# Patient Record
Sex: Male | Born: 1985 | Race: White | Hispanic: No | Marital: Single | Smoking: Light tobacco smoker
Health system: Southern US, Community
[De-identification: ages and names within clinical notes are randomized; demographics above are authoritative.]

## PROBLEM LIST (undated history)

## (undated) DIAGNOSIS — I10 Essential (primary) hypertension: Secondary | ICD-10-CM

---

## 2013-04-10 ENCOUNTER — Emergency Department (HOSPITAL_COMMUNITY)
Admission: EM | Admit: 2013-04-10 | Discharge: 2013-04-10 | Disposition: A | Discharge status: Home / Self Care | Payer: BC Managed Care – PPO | Source: Home / Self Care | Attending: Family Medicine | Admitting: Family Medicine

## 2013-04-10 ENCOUNTER — Encounter (HOSPITAL_COMMUNITY): Payer: Self-pay | Admitting: Emergency Medicine

## 2013-04-10 DIAGNOSIS — K589 Irritable bowel syndrome without diarrhea: Secondary | ICD-10-CM

## 2013-04-10 HISTORY — DX: Essential (primary) hypertension: I10

## 2013-04-10 MED ORDER — LACTINEX PO CHEW: 1.0000 | CHEWABLE_TABLET | Freq: Three times a day (TID) | ORAL | Status: DC

## 2013-04-10 MED ORDER — HYOSCYAMINE SULFATE 0.125 MG PO TABS: 0.1250 mg | ORAL_TABLET | ORAL | Status: DC | PRN

## 2013-04-10 MED ORDER — LIDOCAINE HCL (PF) 1 % IJ SOLN
INTRAMUSCULAR | Status: AC
  Filled 2013-04-10: qty 5

## 2013-04-10 NOTE — ED Notes (Signed)
Pt  Reports   abd  Pain   Described  A s  A  Fullness       With  The  Pain more  To the  r  Side      Symptoms  X  2  Weeks    -  Pt  Reports  As   Well  Symptoms  Of  intermittant  Diarrhea /     Constipation            X  6  Months            Pt  denys  Any  Nausea  /  Vomiting         He  Is  Sitting  Upright on  nexam table  Speaking in  Complete  sentances  In no  Severe  Distress

## 2013-04-10 NOTE — ED Provider Notes (Signed)
Isaiah Turner is a 27 y.o. male who presents to Urgent Care today for abdominal pain. He reports right upper abdmen pinching pain 4/10, making it uncomfortable to sit or lay, for the past 3 days making it hard to sleep. He states pain is constant. He reports 2 weeks of fecal urgency without actually stooling. He also reports pencil-thin stools with lots of mucus and varying constipation or diarrhea. For past 6 months, he has had constipation and diarrhea with lots of mucus in stool. He was on remeron and wellbutrin but stopped these 3 weeks ago but with no subsequent improvement. He does not report nausea or vomiting. He reports mild back pain and a few drops of blood two times in the past 2 weeks with straining. He denies fevers/chills, skin changes, rectal pain or anorectal skin changes, change in appetite, or increased gas. He reports an overall 15 lb weight gain over the past "few months" after starting remeron. He has tried increasing water intake, cutting out alcohol, and cutting back on caffeine to 2 energy drinks per day (200mg  caffeine) compared to previous 3 energy drinks daily. 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   History reviewed. No pertinent past medical history. History  Substance Use Topics  . Smoking status: Never Smoker   . Smokeless tobacco: Not on file  . Alcohol Use: Yes  2 w ago and prior, 6 pack etoh / week; stopped. No tobacco Marijuana use 1 mo ago x 1.  ROS as above Medications reviewed. No current facility-administered medications for this encounter.   Current Outpatient Prescriptions  Medication Sig Dispense Refill  . MINOCYCLINE HCL PO Take by mouth.      . Omega-3 Fatty Acids (FISH OIL PO) Take by mouth.      Marland Kitchen PROPRANOLOL HCL PO Take by mouth.      Melatonin  NKDA  Exam:  BP 153/83  Pulse 94  Temp(Src) 97.6 F (36.4 C) (Oral)  Resp 16  SpO2  100% Gen: Well NAD HEENT: EOMI,  MMM, o/p clear with no ulcers Lungs: Normal work of breathing. CTABL Heart: RRR no MRG Abd: NABS, Soft. Tender right upper quadrant moderate, ND, nonacute Exts: Non edematous BL  LE, warm and well perfused.   No results found for this or any previous visit (from the past 24 hour(s)). No results found.  Assessment and Plan: 27 y.o. male with right upper quadrant abdominal pain with no fevers or chills, not related to food, for last 2 weeks. Unlikely cholecystitis given no fever and not characteristic with no nausea/vomiting and no relationship to food. 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 However, would like pt to be evaluated for IBD given FH and mucus with intermittent drops of blood in stool. - Information for gastroenterologist provided and recommended pt be seen in 1-2 weeks. - Rx'ed hyoscyamine x 1-2 weeks - Recommended restarting antidepressants as anxiety/depression control can help IBS control. - Rx'ed probiotic and recommended continuing yogurt and increasing fiber. - Cut back on caffeine, continue hydration. - Return precautions reviewed. - Elevated BP today likely related to anxiety. F/u with PCP.  Leona Singleton, MD 04/10/13 [phone]

## 2013-04-10 NOTE — ED Provider Notes (Signed)
Medical screening examination/treatment/procedure(s) were performed by resident physician or non-physician practitioner and as supervising physician I was immediately available for consultation/collaboration.   Ailsa Mireles DOUGLAS MD.   Jorie Zee D Merleen Picazo, MD 04/10/13 1659 

## 2013-04-21 ENCOUNTER — Ambulatory Visit
Admission: RE | Admit: 2013-04-21 | Discharge: 2013-04-21 | Disposition: A | Discharge status: Home / Self Care | Payer: BC Managed Care – PPO | Source: Ambulatory Visit | Attending: Gastroenterology | Admitting: Gastroenterology

## 2013-04-21 ENCOUNTER — Other Ambulatory Visit: Payer: Self-pay | Admitting: Gastroenterology

## 2013-04-21 DIAGNOSIS — R109 Unspecified abdominal pain: Secondary | ICD-10-CM

## 2013-04-21 MED ORDER — IOHEXOL 300 MG/ML  SOLN
125.0000 mL | Freq: Once | INTRAMUSCULAR | Status: AC | PRN
  Administered 2013-04-21: 125 mL via INTRAVENOUS

## 2013-04-21 MED ORDER — IOHEXOL 300 MG/ML  SOLN
30.0000 mL | Freq: Once | INTRAMUSCULAR | Status: AC | PRN
  Administered 2013-04-21: 30 mL via ORAL

## 2013-10-09 ENCOUNTER — Emergency Department (HOSPITAL_COMMUNITY)
Admission: EM | Admit: 2013-10-09 | Discharge: 2013-10-09 | Disposition: A | Discharge status: Home / Self Care | Payer: BC Managed Care – PPO | Source: Home / Self Care

## 2013-10-09 ENCOUNTER — Encounter (HOSPITAL_COMMUNITY): Payer: Self-pay | Admitting: Emergency Medicine

## 2013-10-09 DIAGNOSIS — N5089 Other specified disorders of the male genital organs: Secondary | ICD-10-CM

## 2013-10-09 DIAGNOSIS — N509 Disorder of male genital organs, unspecified: Secondary | ICD-10-CM

## 2013-10-09 LAB — POCT URINALYSIS DIP (DEVICE)
Bilirubin Urine: NEGATIVE
GLUCOSE, UA: NEGATIVE mg/dL
Hgb urine dipstick: NEGATIVE
KETONES UR: NEGATIVE mg/dL
Leukocytes, UA: NEGATIVE
Nitrite: NEGATIVE
PROTEIN: NEGATIVE mg/dL
SPECIFIC GRAVITY, URINE: 1.02 (ref 1.005–1.030)
UROBILINOGEN UA: 0.2 mg/dL (ref 0.0–1.0)
pH: 5.5 (ref 5.0–8.0)

## 2013-10-09 NOTE — Discharge Instructions (Signed)
For worsening as listed above go to the Emergency department. If not improving in 24 hours recheck. Recommended go to the ED today. You declined and advised of possible worsening involving injury to reproductive and urinary organs. Ibuprofen for pain.

## 2013-10-09 NOTE — ED Provider Notes (Signed)
CSN: [id number]     Arrival date & time 10/09/13  1523 History   First MD Initiated Contact with Patient 10/09/13 1639     Chief Complaint  Patient presents with  . Inguinal Hernia   (Consider location/radiation/quality/duration/timing/severity/associated sxs/prior Treatment) HPI Comments: Gradual onset of pain in the right pelvic inlet halfway between the medial thigh and scrotum. No trauma. Worse with sitting. Ob includes lifting, pulling. Denies urinary sx's.   Past Medical History  Diagnosis Date  . Hypertension    History reviewed. No pertinent past surgical history. History reviewed. No pertinent family history. History  Substance Use Topics  . Smoking status: Never Smoker   . Smokeless tobacco: Not on file  . Alcohol Use: Yes    Review of Systems  Constitutional: Negative.   Gastrointestinal: Negative for nausea, vomiting and abdominal pain.  Genitourinary: Negative for dysuria, urgency, frequency, flank pain, discharge, penile swelling, scrotal swelling, difficulty urinating, genital sores, penile pain and testicular pain.  Musculoskeletal: Negative.   Skin: Negative.     Allergies  Review of patient's allergies indicates no known allergies.  Home Medications   Prior to Admission medications   Medication Sig Start Date End Date Taking? Authorizing Provider  hyoscyamine (LEVSIN, ANASPAZ) 0.125 MG tablet Take 1 tablet (0.125 mg total) by mouth every 4 (four) hours as needed. 04/10/13   Leona SingletonMaria T Thekkekandam, MD  lactobacillus acidophilus & bulgar (LACTINEX) chewable tablet Chew 1 tablet by mouth 3 (three) times daily with meals. 04/10/13   Leona SingletonMaria T Thekkekandam, MD  MINOCYCLINE HCL PO Take by mouth.    Historical Provider, MD  Omega-3 Fatty Acids (FISH OIL PO) Take by mouth.    Historical Provider, MD  PROPRANOLOL HCL PO Take by mouth.    Historical Provider, MD   BP 134/85  Pulse 106  Temp(Src) 98.4 F (36.9 C) (Oral)  Resp 16  SpO2 100% Physical Exam   Nursing note and vitals reviewed. Constitutional: He is oriented to person, place, and time. He appears well-developed and well-nourished. No distress.  Pulmonary/Chest: Effort normal. No respiratory distress.  Abdominal: Soft. He exhibits no distension. There is no tenderness.  Genitourinary: Penis normal. No penile tenderness.  NEMG. No observed swelling, discoloration, lesions or other abnormalities. No discharge. Bilat testicles descended, no enlargement or tenderness. Normal orientation. No scrotal swelling or lesions. No fluid.No epididymal swelling or tenderness. Nl cremasteric reflexes.  Attempting R cough tap reflex for hernia testing elicits much pain with deep intrusion but no palpable masses. Similar maneuver to the area between the scrotum and proximal/medial thigh produces same pain with deep intrusion, no masses.  Neurological: He is alert and oriented to person, place, and time.  Skin: Skin is warm and dry.  Psychiatric: He has a normal mood and affect.    ED Course  Procedures (including critical care time) Labs Review Labs Reviewed  POCT URINALYSIS DIP (DEVICE)    Imaging Review No results found.   MDM   1. Pain of male genitalia     Discussed findings with Dr. Lorenz CoasterKeller who also examined pt. No definitive dx on exam. No evidence of torsion or diminished circulatory flow.  Pt advised to go the the Ed for additional eval to include imaging. Pt reluctantly declines and wants to wait tonight. Will go for red flag problems discussed and written in instructions.   Hayden Rasmussenavid Mabe, NP 10/09/13 1725

## 2013-10-09 NOTE — ED Notes (Signed)
Concern for poss inguinal hernia. C/o pain right groin area x 2 hours. C/o hurts to sit or walk

## 2013-10-10 ENCOUNTER — Emergency Department (HOSPITAL_COMMUNITY)
Admission: EM | Admit: 2013-10-10 | Discharge: 2013-10-10 | Disposition: A | Discharge status: Home / Self Care | Payer: BC Managed Care – PPO | Attending: Emergency Medicine | Admitting: Emergency Medicine

## 2013-10-10 ENCOUNTER — Emergency Department (HOSPITAL_COMMUNITY): Payer: BC Managed Care – PPO

## 2013-10-10 ENCOUNTER — Encounter (HOSPITAL_COMMUNITY): Payer: Self-pay | Admitting: Emergency Medicine

## 2013-10-10 DIAGNOSIS — R109 Unspecified abdominal pain: Secondary | ICD-10-CM | POA: Insufficient documentation

## 2013-10-10 DIAGNOSIS — R197 Diarrhea, unspecified: Secondary | ICD-10-CM | POA: Insufficient documentation

## 2013-10-10 DIAGNOSIS — I10 Essential (primary) hypertension: Secondary | ICD-10-CM | POA: Insufficient documentation

## 2013-10-10 DIAGNOSIS — R634 Abnormal weight loss: Secondary | ICD-10-CM | POA: Insufficient documentation

## 2013-10-10 DIAGNOSIS — Z792 Long term (current) use of antibiotics: Secondary | ICD-10-CM | POA: Insufficient documentation

## 2013-10-10 DIAGNOSIS — N508 Other specified disorders of male genital organs: Secondary | ICD-10-CM | POA: Insufficient documentation

## 2013-10-10 DIAGNOSIS — R1031 Right lower quadrant pain: Secondary | ICD-10-CM

## 2013-10-10 DIAGNOSIS — R110 Nausea: Secondary | ICD-10-CM | POA: Insufficient documentation

## 2013-10-10 LAB — CBC WITH DIFFERENTIAL/PLATELET
BASOS ABS: 0 10*3/uL (ref 0.0–0.1)
BASOS PCT: 0 % (ref 0–1)
EOS ABS: 0 10*3/uL (ref 0.0–0.7)
EOS PCT: 0 % (ref 0–5)
HCT: 40.4 % (ref 39.0–52.0)
Hemoglobin: 14.1 g/dL (ref 13.0–17.0)
Lymphocytes Relative: 29 % (ref 12–46)
Lymphs Abs: 2.6 10*3/uL (ref 0.7–4.0)
MCH: 31.3 pg (ref 26.0–34.0)
MCHC: 34.9 g/dL (ref 30.0–36.0)
MCV: 89.8 fL (ref 78.0–100.0)
Monocytes Absolute: 0.8 10*3/uL (ref 0.1–1.0)
Monocytes Relative: 9 % (ref 3–12)
NEUTROS PCT: 62 % (ref 43–77)
Neutro Abs: 5.7 10*3/uL (ref 1.7–7.7)
PLATELETS: 308 10*3/uL (ref 150–400)
RBC: 4.5 MIL/uL (ref 4.22–5.81)
RDW: 12.7 % (ref 11.5–15.5)
WBC: 9.2 10*3/uL (ref 4.0–10.5)

## 2013-10-10 LAB — COMPREHENSIVE METABOLIC PANEL
ALT: 42 U/L (ref 0–53)
AST: 28 U/L (ref 0–37)
Albumin: 4.8 g/dL (ref 3.5–5.2)
Alkaline Phosphatase: 104 U/L (ref 39–117)
BUN: 14 mg/dL (ref 6–23)
CALCIUM: 9.8 mg/dL (ref 8.4–10.5)
CO2: 27 mEq/L (ref 19–32)
Chloride: 102 mEq/L (ref 96–112)
Creatinine, Ser: 1.02 mg/dL (ref 0.50–1.35)
GFR calc non Af Amer: >90 mL/min (ref >90)
GLUCOSE: 90 mg/dL (ref 70–99)
POTASSIUM: 4.1 meq/L (ref 3.7–5.3)
Sodium: 142 mEq/L (ref 137–147)
TOTAL PROTEIN: 8 g/dL (ref 6.0–8.3)
Total Bilirubin: 0.7 mg/dL (ref 0.3–1.2)

## 2013-10-10 LAB — URINALYSIS, ROUTINE W REFLEX MICROSCOPIC
Bilirubin Urine: NEGATIVE
GLUCOSE, UA: NEGATIVE mg/dL
Hgb urine dipstick: NEGATIVE
KETONES UR: 15 mg/dL — AB
Leukocytes, UA: NEGATIVE
NITRITE: NEGATIVE
PROTEIN: NEGATIVE mg/dL
Specific Gravity, Urine: 1.025 (ref 1.005–1.030)
Urobilinogen, UA: 0.2 mg/dL (ref 0.0–1.0)
pH: 5.5 (ref 5.0–8.0)

## 2013-10-10 MED ORDER — OXYCODONE-ACETAMINOPHEN 5-325 MG PO TABS: 1.0000 | ORAL_TABLET | ORAL | Status: AC | PRN

## 2013-10-10 MED ORDER — OXYCODONE-ACETAMINOPHEN 5-325 MG PO TABS
1.0000 | ORAL_TABLET | Freq: Once | ORAL | Status: AC
  Administered 2013-10-10: 1 via ORAL
  Filled 2013-10-10: qty 1

## 2013-10-10 NOTE — Discharge Instructions (Signed)
Take percocet as needed for severe pain - Please be careful with this medication.  It can cause drowsiness.  Use caution while driving, operating machinery, drinking alcohol, or any other activities that may impair your physical or mental abilities.   Use a scrotal pillow or other means of comfort at home  Return to the emergency department if you develop any changing/worsening condition, difficulty with urination, fever, repeated vomiting, abdominal pain, or any other concerns (please read additional information regarding your condition below)    Inguinal Strain Your exam shows you have an inguinal strain. This is also known as a pulled groin. This injury is usually due to a pull or partial tear to a muscle or tendon in the groin area. Most groin pulls take several weeks to heal completely. There may be pain with lifting your leg or walking during much of your recovery. Treatment for groin strains includes:  Rest and avoid lifting or performing activities that increase your pain.  Apply ice packs for 20-30 minutes every few hours to reduce pain and swelling over the next 2-3 days.  Medicine to reduce pain and inflammation is often prescribed. HOME CARE INSTRUCTIONS  While most strains in the groin area will heal with rest, you should also watch for any signs of a more serious condition.  SEEK IMMEDIATE MEDICAL CARE IF:   You notice unusual swelling or bulging in the groin.  You have pain or swelling in the testicle.  Blood in your urine.  Marked increased pain.  Weakness or numbness of your leg or abdominal pain. MAKE SURE YOU:   Understand these instructions.  Will watch your condition.  Will get help right away if you are not doing well or get worse. Document Released: 05/17/2004 Document Revised: 07/02/2011 Document Reviewed: 08/14/2007 [facility]Exit[facility] Patient Information 2015 [place], [place]. This information is not intended to replace advice given to you by your health care  provider. Make sure you discuss any questions you have with your health care provider.  Groin Strain A groin strain (also called a groin pull) is an injury to the muscles or tendon on the upper inner part of the thigh. These muscles are called the adductor muscles or groin muscles. They are responsible for moving the leg across the body. A muscle strain occurs when a muscle is overstretched and some muscle fibers are torn. A groin strain can range from mild to severe depending on how many muscle fibers are affected and whether the muscle fibers are partially or completely torn.  Groin strains usually occur during exercise or participation in sports. The injury often happens when a sudden, violent force is placed on a muscle, stretching the muscle too far. A strain is more likely to occur when your muscles are not warmed up or if you are not properly conditioned. Depending on the severity of the groin strain, recovery time may vary from a few weeks to several weeks. Severe injuries often require 4-6 weeks for recovery. In these cases, complete healing can take 4-5 months.  CAUSES   Stretching the groin muscles too far or too suddenly, often during side-to-side motion with an abrupt change in direction.  Putting repeated stress on the groin muscles over a long period of time.  Performing vigorous activity without properly stretching the groin muscles beforehand. SYMPTOMS   Pain and tenderness in the groin area. This begins as sharp pain and persists as a dull ache.  Popping or snapping feeling when the injury occurs (for severe strains).  Swelling or bruising.  Muscle spasms.  Weakness in the leg.  Stiffness in the groin area with decreased ability to move the affected muscles. DIAGNOSIS  Your caregiver will perform a physical exam to diagnose a groin strain. You will be asked about your symptoms and how the injury occurred. X-rays are sometimes needed to rule out a broken bone or cartilage  problems. Your caregiver may order a CT scan or MRI if a complete muscle tear is suspected. TREATMENT  A groin strain will often heal on its own. Your caregiver may prescribe medicines to help manage pain and swelling (anti-inflammatory medicine). You may be told to use crutches for the first few days to minimize your pain. HOME CARE INSTRUCTIONS   Rest. Do not use the strained muscle if it causes pain.  Put ice on the injured area.  Put ice in a plastic bag.  Place a towel between your skin and the bag.  Leave the ice on for 15-20 minutes, every 2-3 hours. Do this for the first 2 days after the injury.  Only take over-the-counter or prescription medicines as directed by your caregiver.  Wrap the injured area with an elastic bandage as directed by your caregiver.  Keep the injured leg raised (elevated).  Walk, stretch, and perform range-of-motion exercises to improve blood flow to the injured area. Only perform these activities if you can do so without any pain. To prevent muscle strains:  Warm up before exercise.  Develop proper conditioning and strength in the groin muscles. SEEK IMMEDIATE MEDICAL CARE IF:   You have increased pain or swelling in the affected area.   Your symptoms are not improving or are getting worse. MAKE SURE YOU:   Understand these instructions.  Will watch your condition.  Will get help right away if you are not doing well or get worse. Document Released: 12/06/2003 Document Revised: 03/26/2012 Document Reviewed: 12/12/2011 [facility]Exit[facility] Patient Information 2015 [place], [place]. This information is not intended to replace advice given to you by your health care provider. Make sure you discuss any questions you have with your health care provider.  Testicular Self-Exam A self-examination of your testicles involves looking at and feeling your testicles for abnormal lumps or swelling. Several things can cause swelling, lumps, or pain in your testicles.  Some of these causes are:  Injuries.  Inflammation.  Infection.  Accumulation of fluids around your testicle (hydrocele).  Twisted testicles (testicular torsion).  Testicular cancer. Self-examination of the testicles and groin areas may be advised if you are at risk for testicular cancer. Risks for testicular cancer include:  An undescended testicle (cryptorchidism).  A history of previous testicular cancer.  A family history of testicular cancer. The testicles are easiest to examine after warm baths or showers and are more difficult to examine when you are cold. This is because the muscles attached to the testicles retract and pull them up higher or into the abdomen. Follow these steps while you are standing:  Hold your penis away from your body.  Roll one testicle between your thumb and forefinger, feeling the entire testicle.  Roll the other testicle between your thumb and forefinger, feeling the entire testicle. Feel for lumps, swelling, or discomfort. A normal testicle is egg shaped and feels firm. It is smooth and not tender. The spermatic cord can be felt as a firm spaghetti-like cord at the back of your testicle. It is also important to examine the crease between the front of your leg and your abdomen. Feel  for any bumps that are tender. These could be enlarged lymph nodes.  Document Released: 07/16/2000 Document Revised: 12/10/2012 Document Reviewed: 09/29/2012 [facility] Patient Information 2015 [place], [place]. This information is not intended to replace advice given to you by your health care provider. Make sure you discuss any questions you have with your health care provider.

## 2013-10-10 NOTE — ED Notes (Signed)
Patient declined wheelchair. RN escorted to exit 

## 2013-10-10 NOTE — ED Provider Notes (Signed)
CSN: [id number]     Arrival date & time 10/10/13  1626 History   First MD Initiated Contact with Patient 10/10/13 1629     Chief Complaint  Patient presents with  . Groin Pain   HPI  Jamse Arnndrew T Gorley is a 28 y.o. male with a PMH of HTN who presents to the ED for evaluation of groin pain. History was provided by the patient. Patient states that he has had gradually worsening right groin pain since yesterday at 2:00 pm. His constant pain is located in his right groin and testicle. He denies any injuries or trauma. He describes an aching discomfort which is worse with sitting and movement. 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 He denies any previous pain in the past. He states he took Ibuprofen with no relief. He denies any dysuria, penile discharge, scrotal edema/erytheam, genital sores, penile pain, or abdominal pain. He has had nausea with no emesis. Also loose stools. No hematochezia. No also reports a 20 pound weight loss over the past month, which was unintentional. No fevers, night sweats, cough, headache, or other concerns. Admits to being sexually active with new recent sexual partners. [facility] and was advised to come to the ED however he went home and comes to the ED today for continued pain and concerns.     Past Medical History  Diagnosis Date  . Hypertension    No past surgical history on file. No family history on file. History  Substance Use Topics  . Smoking status: Never Smoker   . Smokeless tobacco: Not on file  . Alcohol Use: Yes    Review of Systems  Constitutional: Positive for unexpected weight change. Negative for fever, chills, activity change, appetite change and fatigue.  Respiratory: Negative for cough and shortness of breath.   Cardiovascular: Negative for chest pain and leg swelling.  Gastrointestinal: Positive for nausea and diarrhea. Negative for  vomiting, abdominal pain, constipation and blood in stool.  Genitourinary: Positive for testicular pain. Negative for dysuria, urgency, frequency, hematuria, flank pain, decreased urine volume, discharge, penile swelling, scrotal swelling, difficulty urinating, genital sores and penile pain.  Musculoskeletal: Negative for back pain and myalgias.  Neurological: Negative for dizziness, weakness, light-headedness and headaches.    Allergies  Review of patient's allergies indicates no known allergies.  Home Medications   Prior to Admission medications   Medication Sig Start Date End Date Taking? Authorizing Provider  hyoscyamine (LEVSIN, ANASPAZ) 0.125 MG tablet Take 1 tablet (0.125 mg total) by mouth every 4 (four) hours as needed. 04/10/13   Leona SingletonMaria T Thekkekandam, MD  lactobacillus acidophilus & bulgar (LACTINEX) chewable tablet Chew 1 tablet by mouth 3 (three) times daily with meals. 04/10/13   Leona SingletonMaria T Thekkekandam, MD  MINOCYCLINE HCL PO Take by mouth.    Historical Provider, MD  Omega-3 Fatty Acids (FISH OIL PO) Take by mouth.    Historical Provider, MD  PROPRANOLOL HCL PO Take by mouth.    Historical Provider, MD   BP 150/102  Pulse 105  Temp(Src) 98.2 F (36.8 C) (Oral)  Resp 18  SpO2 99%  Filed Vitals:   10/10/13 1848 10/10/13 1900 10/10/13 2002 10/10/13 2004  BP: 134/77 139/79 165/103 143/82  Pulse: 92 87 101   Temp: 98.7 F (37.1 C)  97.9 F (36.6 C)   TempSrc: Oral  Oral   Resp: 18  18  SpO2: 93% 94% 100%     Physical Exam  Nursing note and vitals reviewed. Constitutional: He is oriented to person, place, and time. He appears well-developed and well-nourished. No distress.  HENT:  Head: Normocephalic and atraumatic.  Right Ear: External ear normal.  Left Ear: External ear normal.  Mouth/Throat: Oropharynx is clear and moist.  Eyes: Conjunctivae are normal. Left eye exhibits no discharge.  Neck: Normal range of motion. Neck supple.  Cardiovascular: Normal rate,  regular rhythm and normal heart sounds.  Exam reveals no gallop and no friction rub.   No murmur heard. Pulmonary/Chest: Effort normal and breath sounds normal. No respiratory distress. He has no wheezes. He has no rales. He exhibits no tenderness.  Abdominal: Soft. He exhibits no distension and no mass. There is no tenderness. There is no rebound and no guarding.  Genitourinary:  Tenderness to palpation to the right testicle. Negative Phren's sign. No scrotal edema or erythema. No left testicle tenderness. Patient has pain with palpation in his inguinal canal on the right with no inguinal hernia. No inguinal canal tenderness or hernia on the left. No inguinal LAD bilaterally. No groin masses or edema bilaterally.   Musculoskeletal: He exhibits no edema and no tenderness.  No CVA, lumbar, or flank tenderness bilaterally.   Neurological: He is alert and oriented to person, place, and time.  Skin: Skin is warm and dry. He is not diaphoretic.    ED Course  Procedures (including critical care time) Labs Review Labs Reviewed - No data to display  Imaging Review Ct Abdomen Pelvis Wo Contrast  10/10/2013   CLINICAL DATA:  28 year old male with abdominal and pelvic pain and nausea.  EXAM: CT ABDOMEN AND PELVIS WITHOUT CONTRAST  TECHNIQUE: Multidetector CT imaging of the abdomen and pelvis was performed following the standard protocol without IV contrast.  COMPARISON:  The lung bases are clear.  The liver, gallbladder, spleen, pancreas, adrenal glands and kidneys are unremarkable.  There is no evidence of hydronephrosis or urinary calculi.  Please note that parenchymal abnormalities may be missed without intravenous contrast.  There is no evidence of free fluid, enlarged lymph nodes, biliary dilation or abdominal aortic aneurysm.  The bowel and bladder are unremarkable. There is no evidence of bowel obstruction, pneumoperitoneum or focal collection.  No acute or suspicious bony abnormalities are present.   FINDINGS: Unremarkable exam.  No evidence of acute or significant abnormality.   Electronically Signed   By: Laveda AbbeJeff  Hu M.D.   On: 10/10/2013 19:43   [place] Scrotum  10/10/2013   CLINICAL DATA:  28 year old male with right testicular pain.  EXAM: SCROTAL ULTRASOUND  DOPPLER ULTRASOUND OF THE TESTICLES  TECHNIQUE: Complete ultrasound examination of the testicles, epididymis, and other scrotal structures was performed. Color and spectral Doppler ultrasound were also utilized to evaluate blood flow to the testicles.  COMPARISON:  None.  FINDINGS: Right testicle  Measurements: 3.7 x 1.9 x 3 cm. No mass or microlithiasis visualized.  Left testicle  Measurements: 4.5 x 2.3 x 4.1 cm. No mass or microlithiasis visualized.  Right epididymis:  Normal in size and appearance.  Left epididymis:  A 3 mm epididymal cyst is present.  Hydrocele:  Small bilateral hydroceles are present.  Varicocele:  None visualized.  Pulsed Doppler interrogation of both testes demonstrates low resistance arterial and venous waveforms bilaterally.  IMPRESSION: Normal testicles.  No evidence of testicular torsion or mass.  Small bilateral hydroceles without other significant abnormality.   Electronically Signed   By: Trey PaulaJeff  Hu M.D.   On: 10/10/2013 17:34   [place] Art/ven Flow Abd Pelv Doppler  10/10/2013   CLINICAL DATA:  28 year old male with right testicular pain.  EXAM: SCROTAL ULTRASOUND  DOPPLER ULTRASOUND OF THE TESTICLES  TECHNIQUE: Complete ultrasound examination of the testicles, epididymis, and other scrotal structures was performed. Color and spectral Doppler ultrasound were also utilized to evaluate blood flow to the testicles.  COMPARISON:  None.  FINDINGS: Right testicle  Measurements: 3.7 x 1.9 x 3 cm. No mass or microlithiasis visualized.  Left testicle  Measurements: 4.5 x 2.3 x 4.1 cm. No mass or microlithiasis visualized.  Right epididymis:  Normal in size and appearance.  Left epididymis:  A 3 mm epididymal cyst is present.  Hydrocele:   Small bilateral hydroceles are present.  Varicocele:  None visualized.  Pulsed Doppler interrogation of both testes demonstrates low resistance arterial and venous waveforms bilaterally.  IMPRESSION: Normal testicles.  No evidence of testicular torsion or mass.  Small bilateral hydroceles without other significant abnormality.   Electronically Signed   By: Laveda Abbe M.D.   On: 10/10/2013 17:34     EKG Interpretation None      Results for orders placed during the hospital encounter of 10/10/13  URINALYSIS, ROUTINE W REFLEX MICROSCOPIC      Result Value Ref Range   Color, Urine YELLOW  YELLOW   APPearance CLEAR  CLEAR   Specific Gravity, Urine 1.025  1.005 - 1.030   pH 5.5  5.0 - 8.0   Glucose, UA NEGATIVE  NEGATIVE mg/dL   Hgb urine dipstick NEGATIVE  NEGATIVE   Bilirubin Urine NEGATIVE  NEGATIVE   Ketones, ur 15 (*) NEGATIVE mg/dL   Protein, ur NEGATIVE  NEGATIVE mg/dL   Urobilinogen, UA 0.2  0.0 - 1.0 mg/dL   Nitrite NEGATIVE  NEGATIVE   Leukocytes, UA NEGATIVE  NEGATIVE  CBC WITH DIFFERENTIAL      Result Value Ref Range   WBC 9.2  4.0 - 10.5 K/uL   RBC 4.50  4.22 - 5.81 MIL/uL   Hemoglobin 14.1  13.0 - 17.0 g/dL   HCT 16.1  09.6 - 04.5 %   MCV 89.8  78.0 - 100.0 fL   MCH 31.3  26.0 - 34.0 pg   MCHC 34.9  30.0 - 36.0 g/dL   RDW 40.9  81.1 - 91.4 %   Platelets 308  150 - 400 K/uL   Neutrophils Relative % 62  43 - 77 %   Neutro Abs 5.7  1.7 - 7.7 K/uL   Lymphocytes Relative 29  12 - 46 %   Lymphs Abs 2.6  0.7 - 4.0 K/uL   Monocytes Relative 9  3 - 12 %   Monocytes Absolute 0.8  0.1 - 1.0 K/uL   Eosinophils Relative 0  0 - 5 %   Eosinophils Absolute 0.0  0.0 - 0.7 K/uL   Basophils Relative 0  0 - 1 %   Basophils Absolute 0.0  0.0 - 0.1 K/uL  COMPREHENSIVE METABOLIC PANEL      Result Value Ref Range   Sodium 142  137 - 147 mEq/L   Potassium 4.1  3.7 - 5.3 mEq/L   Chloride 102  96 - 112 mEq/L   CO2 27  19 - 32 mEq/L   Glucose, Bld 90  70 - 99 mg/dL   BUN 14  6 - 23 mg/dL    Creatinine, Ser 7.82  0.50 - 1.35 mg/dL   Calcium 9.8  8.4 -  10.5 mg/dL   Total Protein 8.0  6.0 - 8.3 g/dL   Albumin 4.8  3.5 - 5.2 g/dL   AST 28  0 - 37 U/L   ALT 42  0 - 53 U/L   Alkaline Phosphatase 104  39 - 117 U/L   Total Bilirubin 0.7  0.3 - 1.2 mg/dL   GFR calc non Af Amer >90  >90 mL/min   GFR calc Af Amer >90  >90 mL/min     MDM   RITO LECOMTE is a 28 y.o. male with a PMH of HTN who presents to the ED for evaluation of groin pain. Etiology of groin pain possibly due to a strain. Scrotal US negative for torsion or masses. Small bilateral hydroceles were seen but this is unlikely the cause of his pain. CT abdomen and pelvis negative for hernia or other acute abnormalities. No palpable hernia on exam. Labs and urine were unremarkable. Patient tested for gonorrhea and chlamydia with results pending. Patient had improvements in his pain throughout his ED visit. Vital signs stable. Patient afebrile and non-toxic in appearance. Instructed to follow-up with PCP, which he is in the process of establishing. Cause of recent unintentional weight loss unclear. No red flags on hx or exam. Patient also to follow-up with his PCP regarding this. Return precautions, discharge instructions, and follow-up was discussed with the patient before discharge.     New Prescriptions   OXYCODONE-ACETAMINOPHEN (PERCOCET/ROXICET) 5-325 MG PER TABLET    Take 1-2 tablets by mouth every 4 (four) hours as needed for severe pain.     Final impressions: 1. Right groin pain       Luiz Iron PA-C   This patient was discussed with Dr. Candise Bowens, PA-C 10/10/13 2016

## 2013-10-10 NOTE — ED Notes (Signed)
Pt c/o right testicle pain x 2 days, denies dysuria/penile discharge/rash/bumps in location. sts pain worsens with palpation. sts he was seen at urgent care for the same. Nad, skin warm and dry, resp e/u.

## 2013-10-10 NOTE — ED Notes (Signed)
PT reports right groin and testicle pain since yesterday. [facility] "[facility]." Pt reports pain increased today. Denies swelling, denies urinary symptoms. Pain increases with movement; better with standing. NAD.

## 2013-10-10 NOTE — ED Provider Notes (Signed)
Medical screening examination/treatment/procedure(s) were conducted as a shared visit with non-physician practitioner(s) and myself.  I personally evaluated the patient during the encounter.  R inguinal pain after lifting.  Abdomen soft and nontender.  R testicle nontender.  Pain in R inguinal canal to palpation without palpable hernia.  No evidence of hernia on exam.  EKG Interpretation None        Glynn OctaveStephen Rancour, MD 10/10/13 2337

## 2013-10-13 LAB — GC/CHLAMYDIA PROBE AMP
CT PROBE, AMP APTIMA: NEGATIVE
GC Probe RNA: NEGATIVE

## 2013-10-13 NOTE — ED Provider Notes (Signed)
Medical screening examination/treatment/procedure(s) were performed by resident physician or non-physician practitioner and as supervising physician I was immediately available for consultation/collaboration.   Rayhana Slider DOUGLAS MD.   Tanishia Lemaster D Shakira Los, MD 10/13/13 1504 

## 2014-03-25 ENCOUNTER — Encounter (HOSPITAL_COMMUNITY): Payer: Self-pay | Admitting: Family Medicine

## 2014-03-25 ENCOUNTER — Emergency Department (HOSPITAL_COMMUNITY)
Admission: EM | Admit: 2014-03-25 | Discharge: 2014-03-25 | Disposition: A | Discharge status: Home / Self Care | Payer: BC Managed Care – PPO | Source: Home / Self Care | Attending: Family Medicine | Admitting: Family Medicine

## 2014-03-25 DIAGNOSIS — F1990 Other psychoactive substance use, unspecified, uncomplicated: Secondary | ICD-10-CM

## 2014-03-25 DIAGNOSIS — M7918 Myalgia, other site: Secondary | ICD-10-CM

## 2014-03-25 DIAGNOSIS — I10 Essential (primary) hypertension: Secondary | ICD-10-CM

## 2014-03-25 DIAGNOSIS — M791 Myalgia: Secondary | ICD-10-CM

## 2014-03-25 DIAGNOSIS — Z720 Tobacco use: Secondary | ICD-10-CM

## 2014-03-25 MED ORDER — LISINOPRIL 5 MG PO TABS: 5.0000 mg | ORAL_TABLET | Freq: Every day | ORAL | Status: DC

## 2014-03-25 NOTE — Discharge Instructions (Signed)
Your blood pressure is too high and needs treatment.  Please start the lisinopril and get your blood work checked in 1-2 weeks Please [facility] or other medical office Please take ibuprofen 400-600mg  every 4-6 hours or your back and chest pain as these are likely musculoskeletal in nature Please go to the emergency room if you get worse.

## 2014-03-25 NOTE — ED Notes (Signed)
Pt states that he has has chest pain for 3 days along with high blood pressure. Pt is in no acute distress at this time.

## 2014-03-25 NOTE — ED Provider Notes (Signed)
CSN: [id number]     Arrival date & time 03/25/14  0802 History   First MD Initiated Contact with Patient 03/25/14 0815     Chief Complaint  Patient presents with  . Chest Pain  . Hypertension   (Consider location/radiation/quality/duration/timing/severity/associated sxs/prior Treatment) HPI   Came off of blood pressure medications several months ago. Monitors at home. Systolic typically below 140s and diastolic less than 90. Yesterday BP was up to 163/100 on multiple reads. Unsure of which medication he used to take. No CP actively.  CP started 3 days ago. Dull but sharp. Worsens over the course of the day. CP improves w/ L arm elevation. L chest location w/ radiation to neck and arm. Worse w/ sitting and certain movements. Denies HA, palpitaitons, nausea, diaphoresis. Increased stress and ETOH use in last 2 wks. 2-8 beers nightly. Last ETOH Sunday and CP started the day after.   Sharp pain in back when sitting or driving for a long period of time. Change in sleeping positions recently.    Past Medical History  Diagnosis Date  . Hypertension    History reviewed. No pertinent past surgical history. History reviewed. No pertinent family history. History  Substance Use Topics  . Smoking status: Light Tobacco Smoker -- 0.01 packs/day    Types: Cigarettes  . Smokeless tobacco: Not on file  . Alcohol Use: Yes     Comment: 4 drinks a night per pt    Review of Systems Per HPI with all other pertinent systems negative.   Allergies  Review of patient's allergies indicates no known allergies.  Home Medications   Prior to Admission medications   Medication Sig Start Date End Date Taking? Authorizing Provider  hyoscyamine (LEVSIN, ANASPAZ) 0.125 MG tablet Take 1 tablet (0.125 mg total) by mouth every 4 (four) hours as needed. 04/10/13   Leona SingletonMaria T Thekkekandam, MD  lactobacillus acidophilus & bulgar (LACTINEX) chewable tablet Chew 1 tablet by mouth 3 (three) times daily with meals.  04/10/13   Leona SingletonMaria T Thekkekandam, MD  lisinopril (PRINIVIL,ZESTRIL) 5 MG tablet Take 1 tablet (5 mg total) by mouth daily. 03/25/14   Ozella Rocksavid J Emeterio Balke, MD  MINOCYCLINE HCL PO Take by mouth.    Historical Provider, MD  NUVIGIL 150 MG tablet Take 150 mg by mouth daily. 09/07/13   Historical Provider, MD  Omega-3 Fatty Acids (FISH OIL PO) Take by mouth.    Historical Provider, MD  oxyCODONE-acetaminophen (PERCOCET/ROXICET) 5-325 MG per tablet Take 1-2 tablets by mouth every 4 (four) hours as needed for severe pain. 10/10/13   Janene HarveyJessica K Palmer, PA-C  PROPRANOLOL HCL PO Take by mouth.    Historical Provider, MD   BP 155/90 mmHg  Pulse 95  Temp(Src) 98 F (36.7 C) (Oral)  Resp 16  SpO2 100% Physical Exam  Constitutional: He is oriented to person, place, and time. He appears well-developed and well-nourished. No distress.  HENT:  Head: Normocephalic and atraumatic.  Eyes: EOM are normal. Pupils are equal, round, and reactive to light.  Neck: Normal range of motion.  Cardiovascular: Normal rate, normal heart sounds and intact distal pulses.   No murmur heard. Pulmonary/Chest: Effort normal and breath sounds normal.  Abdominal: Soft.  Musculoskeletal: Normal range of motion. He exhibits no tenderness.  Neurological: He is alert and oriented to person, place, and time.  Skin: Skin is warm. He is not diaphoretic.  Psychiatric: He has a normal mood and affect. His behavior is normal. Judgment and thought content normal.  ED Course  Procedures (including critical care time) Labs Review Labs Reviewed - No data to display  Imaging Review No results found.   MDM   1. Musculoskeletal pain   2. Essential hypertension   3. Tobacco use   4. Drug use    HTN: Cr nml. Restart lisinopril. F[facility] or other [facility] for blood work and establish care  MSK pain: CP and back pain are MSK in nature. EKG NSR, no sign of ACS, rate ~70. No previous tracing to compare. NSAIDs  Some of his  complaints may be coming from ETOH withdrawal as pt was heavy drinker prior to stopping cold [place] 3-4 days ago.   Precautions given and all questions answered  [name], MD Family Medicine 03/25/2014, 8:57 AM      [name], MD 03/25/14 [phone]

## 2014-04-01 ENCOUNTER — Ambulatory Visit (INDEPENDENT_AMBULATORY_CARE_PROVIDER_SITE_OTHER): Payer: BC Managed Care – PPO | Admitting: Emergency Medicine

## 2014-04-01 VITALS — BP 136/92 | HR 98 | Temp 97.9°F | Resp 16 | Ht 75.5 in | Wt 207.0 lb

## 2014-04-01 DIAGNOSIS — I10 Essential (primary) hypertension: Secondary | ICD-10-CM

## 2014-04-01 LAB — COMPREHENSIVE METABOLIC PANEL
ALK PHOS: 102 U/L (ref 39–117)
ALT: 43 U/L (ref 0–53)
AST: 25 U/L (ref 0–37)
Albumin: 4.9 g/dL (ref 3.5–5.2)
BILIRUBIN TOTAL: 0.5 mg/dL (ref 0.2–1.2)
BUN: 16 mg/dL (ref 6–23)
CO2: 24 mEq/L (ref 19–32)
Calcium: 9.9 mg/dL (ref 8.4–10.5)
Chloride: 103 mEq/L (ref 96–112)
Creat: 0.93 mg/dL (ref 0.50–1.35)
GLUCOSE: 97 mg/dL (ref 70–99)
Potassium: 4.2 mEq/L (ref 3.5–5.3)
Sodium: 138 mEq/L (ref 135–145)
Total Protein: 8.1 g/dL (ref 6.0–8.3)

## 2014-04-01 LAB — POCT URINALYSIS DIPSTICK
Bilirubin, UA: NEGATIVE
Glucose, UA: NEGATIVE
Ketones, UA: NEGATIVE
Leukocytes, UA: NEGATIVE
NITRITE UA: NEGATIVE
PROTEIN UA: NEGATIVE
RBC UA: NEGATIVE
SPEC GRAV UA: 1.025
UROBILINOGEN UA: 0.2
pH, UA: 5.5

## 2014-04-01 LAB — POCT CBC
Granulocyte percent: 67.4 %G (ref 37–80)
HCT, POC: 45.1 % (ref 43.5–53.7)
Hemoglobin: 15.4 g/dL (ref 14.1–18.1)
Lymph, poc: 1.8 (ref 0.6–3.4)
MCH: 31.9 pg — AB (ref 27–31.2)
MCHC: 34.2 g/dL (ref 31.8–35.4)
MCV: 93.3 fL (ref 80–97)
MID (cbc): 0.4 (ref 0–0.9)
MPV: 7.9 fL (ref 0–99.8)
POC Granulocyte: 4.6 (ref 2–6.9)
POC LYMPH %: 26.4 % (ref 10–50)
POC MID %: 6.2 % (ref 0–12)
Platelet Count, POC: 326 10*3/uL (ref 142–424)
RBC: 4.84 M/uL (ref 4.69–6.13)
RDW, POC: 12.5 %
WBC: 6.8 10*3/uL (ref 4.6–10.2)

## 2014-04-01 LAB — TSH: TSH: 1.557 u[IU]/mL (ref 0.350–4.500)

## 2014-04-01 MED ORDER — LISINOPRIL 20 MG PO TABS: 20.0000 mg | ORAL_TABLET | Freq: Every day | ORAL | Status: DC

## 2014-04-01 MED ORDER — LISINOPRIL 20 MG PO TABS: 20.0000 mg | ORAL_TABLET | Freq: Every day | ORAL | Status: AC

## 2014-04-01 MED ORDER — CLONIDINE HCL 0.1 MG PO TABS: 0.1000 mg | ORAL_TABLET | Freq: Two times a day (BID) | ORAL | Status: DC

## 2014-04-01 NOTE — Progress Notes (Signed)
   Subjective:    Patient ID: Isaiah Turner, male    DOB: 10/14/1985, 28 y.o.   MRN: [id number] This chart was scribed by Littie Deedsichard Sun, Medical Scribe, for Lesle ChrisSteven Liam Bossman, MD. Patient was seen in room 13 and patient care was started at 9:27 AM.  HPI Isaiah Arnndrew T Najjar is a 28 y.o. male who [facility] complaining of HTN. Patient has been on 20mg  lisinopril for 1 week; he has been on 10mg  lisinopril for 4 months in the past, and has been off of it for 6 months before starting it again last week. He states his BP has still been high, usually around 140/90 and has been having some chest pain. He [facility] his BP [facility]; it was measured at 160/95 at that time. Patient does have a blood pressure cuff at home. Patient has FMHx of HTN; his grandfather on his father's side has HTN. His last blood work was done in February this year and he had an EKG last week. He denies snoring and signs of sleep apnea. Patient also denies use of decongestants and stimulants.  Patient states he has some career-related stress and that his stress levels has been higher than normal in the past few weeks. He was [place] to [place] [employer] [place], but it didn't work out. He normally drinks 2 beers per night, but the past 4-6 weeks, he has been drinking 6 beers a night. Patient also reports very slight cigarette and marijuana use. He states his family does not have EtOH addiction issues.  Patient works for [employer].  Review of Systems  Cardiovascular: Positive for chest pain.       Objective:   Physical Exam  Cardiovascular: Normal rate, regular rhythm and normal heart sounds.  Exam reveals no gallop and no friction rub.   No murmur heard. Pulmonary/Chest: Effort normal and breath sounds normal. No respiratory distress. He has no wheezes. He has no rales.   Slightly anxious, but very cooperative. Repeat BP reading: 124/82       Assessment & Plan:   I advised the patient it will take him a while to get his blood pressure under control. I do not think he needs to increase the dosage of his medication at the present time.if he gets in a stressful situation. I encouraged him to continue to cut back on his alcohol intake and exercise.I personally performed the services described in this documentation, which was scribed in my presence. The recorded information has been reviewed and is accurate because he has tried to cut back on his alcohol and some of his symptoms may be secondary to alcohol withdrawal I will given clonidine 0.1 mg to take twice a day which will help with blood pressure.

## 2014-04-01 NOTE — Patient Instructions (Signed)

## 2014-04-01 NOTE — Addendum Note (Signed)
Addended by: Lesle ChrisAUB, Lindel Marcell A on: 04/01/2014 10:20 AM   Modules accepted: Orders

## 2014-04-02 ENCOUNTER — Encounter: Payer: Self-pay | Admitting: Radiology

## 2014-04-29 ENCOUNTER — Ambulatory Visit (INDEPENDENT_AMBULATORY_CARE_PROVIDER_SITE_OTHER): Payer: BLUE CROSS/BLUE SHIELD | Admitting: Emergency Medicine

## 2014-04-29 ENCOUNTER — Encounter: Payer: Self-pay | Admitting: Emergency Medicine

## 2014-04-29 VITALS — BP 120/80 | HR 108 | Temp 98.1°F | Resp 16 | Ht 74.5 in | Wt 201.8 lb

## 2014-04-29 DIAGNOSIS — I10 Essential (primary) hypertension: Secondary | ICD-10-CM

## 2014-04-29 MED ORDER — AMLODIPINE BESYLATE 5 MG PO TABS: 5.0000 mg | ORAL_TABLET | Freq: Every day | ORAL | Status: DC

## 2014-04-29 MED ORDER — METOPROLOL SUCCINATE ER 25 MG PO TB24: 25.0000 mg | ORAL_TABLET | Freq: Every day | ORAL | Status: AC

## 2014-04-29 NOTE — Progress Notes (Deleted)
   Subjective:    Patient ID: Isaiah Turner, male    DOB: 02/01/1986, 29 y.o.   MRN: [id number]  HPI    Review of Systems     Objective:   Physical Exam        Assessment & Plan:

## 2014-04-29 NOTE — Progress Notes (Addendum)
   Subjective:  This chart was scribed for Isaiah GobbleSteven A Makalah Asberry, MD by [name], ED Scribe. The patient was seen in room 22. Patient's care was started at 10:25 AM.  Patient ID: Isaiah Turner, male    DOB: 07/26/1985, 29 y.o.   MRN: [id number]  HPI  HPI Comments: Isaiah Turner is a 29 y.o. male who presents to the [facility] for follow up with his BP. When he was seen here December 10th his lab work and EKG were within normal limits. He states that his BP has still been higher than he would like. He has been taking Clonidine with some relief, which describes as an "uncomfortable" medication. He feels the clonidine has constipated him. He states that he walks 1 mile 4-5 times per week after work. He states that he has cut back on salt. He normally drinks two energy drinks per day which has the same caffeine content as two cups of coffee, but he has not had this for the past two weeks. He states he has been taking Lisinopril 20 MG, and he does not need a refill for this. He states that he had a financial burden over the holidays when the fuel pump on his car died. He states that work has not been stressful. He reports recent dysphoric mood, and he states that it is likely seasonal and related to his search for a new job. He states that has not drank alcohol for about three weeks.    Review of Systems  Constitutional: Negative for fever and chills.  HENT: Negative for congestion and ear pain.   Respiratory: Negative for cough, chest tightness, shortness of breath and wheezing.   Cardiovascular: Negative for chest pain and palpitations.  Gastrointestinal: Negative for nausea, vomiting, abdominal pain, diarrhea, constipation and abdominal distention.  Genitourinary: Negative for dysuria, urgency, frequency, hematuria and difficulty urinating.  Musculoskeletal: Negative for back pain, arthralgias and neck pain.  Skin: Negative for rash and wound.  Neurological: Negative for dizziness,  weakness, numbness and headaches.   Objective:  Physical Exam  CONSTITUTIONAL: Well developed/well nourished HEAD: Normocephalic/atraumatic EYES: EOMI/PERRL ENMT: Mucous membranes moist NECK: supple no meningeal signs SPINE/BACK:entire spine nontender CV: S1/S2 noted, no murmurs/rubs/gallops noted, right arm BP 136/84, left arm BP 134/86 LUNGS: Lungs are clear to auscultation bilaterally, no apparent distress ABDOMEN: soft, nontender, no rebound or guarding, bowel sounds noted throughout abdomen GU:no cva tenderness NEURO: Pt is awake/alert/appropriate, moves all extremitiesx4.  No facial droop.   EXTREMITIES: pulses normal/equal, full ROM SKIN: warm, color normal PSYCH: no abnormalities of mood noted, alert and oriented to situation  Assessment & Plan:  Blood pressure on repeat was not well wanted to be. He is currently on lisinopril 20 one a day. We stopped the clonidine he was on and added Toprol-XL 25 one a day and see if that would help some with the tachycardia he is having. I will recheck him in one month. Patient states he is cut way back on his alcohol intake. I do want to see him back in a month just to see how he is doing with everything. He will try half tablet a day first and monitor things and see how he doesn't bring in his sheet when I see him back in a month

## 2014-05-18 ENCOUNTER — Other Ambulatory Visit: Payer: Self-pay | Admitting: Emergency Medicine

## 2014-06-01 ENCOUNTER — Ambulatory Visit: Payer: BLUE CROSS/BLUE SHIELD | Admitting: Emergency Medicine

## 2015-06-03 IMAGING — CT CT ABD-PELV W/ CM
2 of 4 series · 17 of 46 positions shown, 19 images · IV contrast (30CC OMNI 300 & [ID] OMNI 300)
Comparison: No priors.

CLINICAL DATA: Right lower quadrant abdominal pain. Constipation.
Loss of appetite for the past 3 weeks.

EXAM:
CT ABDOMEN AND PELVIS WITH CONTRAST
TECHNIQUE: Multidetector CT imaging of the abdomen and pelvis was performed
using the standard protocol following bolus administration of
intravenous contrast.
CONTRAST:  30mL OMNIPAQUE IOHEXOL 300 MG/ML SOLN, 125mL OMNIPAQUE
IOHEXOL 300 MG/ML SOLN

[Series 2: abd/pelvis with · axial · 0.78mm/px · z∈[-440,+50]mm · 14 of 108 slices shown, 16 images]
[im 5/108  soft-tissue]
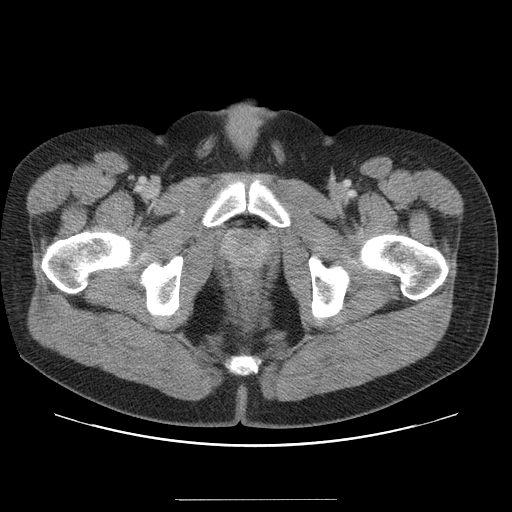
[im 5/108  bone]
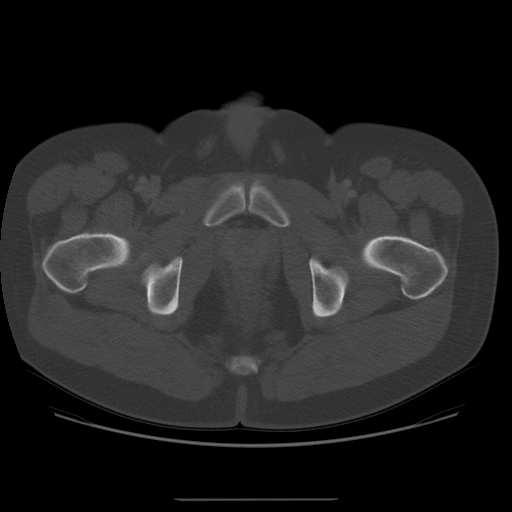
[im 13/108  soft-tissue]
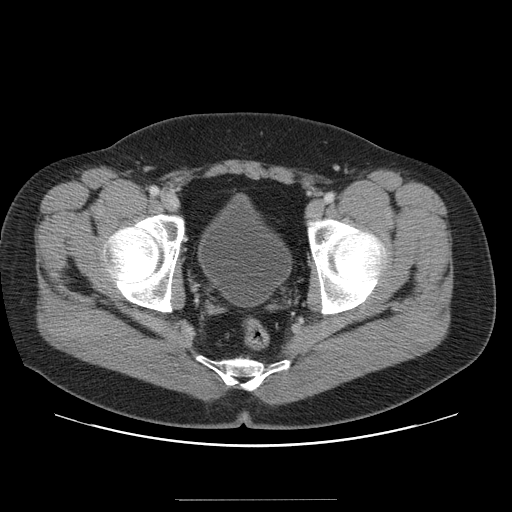
[im 22/108  soft-tissue]
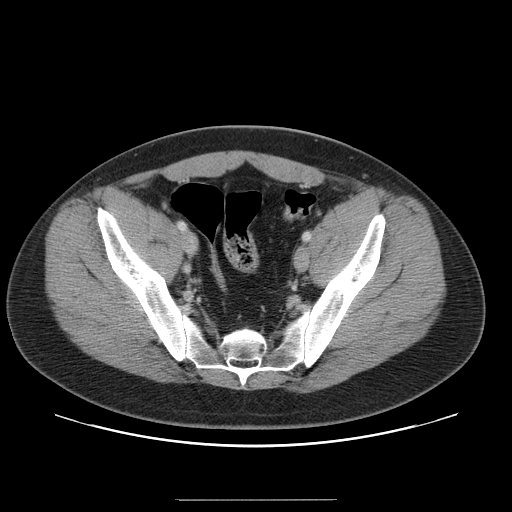
[im 30/108  soft-tissue]
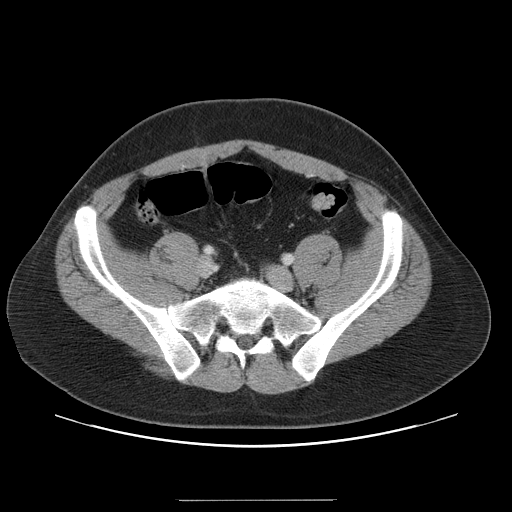
[im 35/108  soft-tissue]
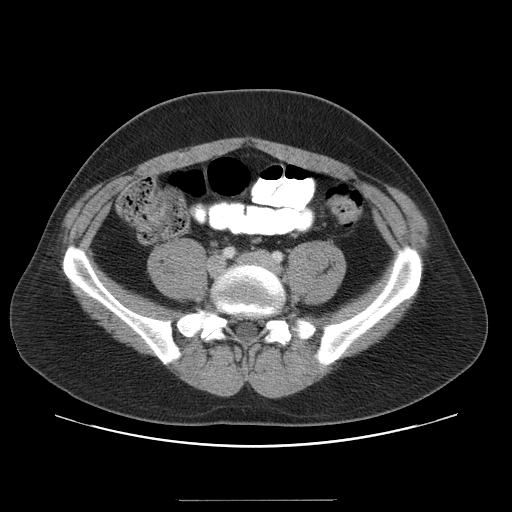
[im 43/108  soft-tissue]
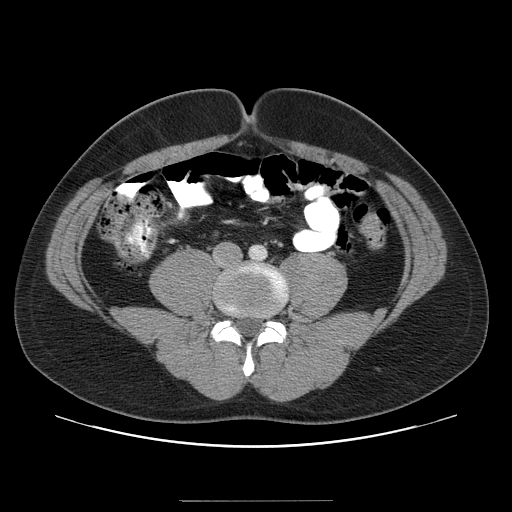
[im 52/108  soft-tissue]
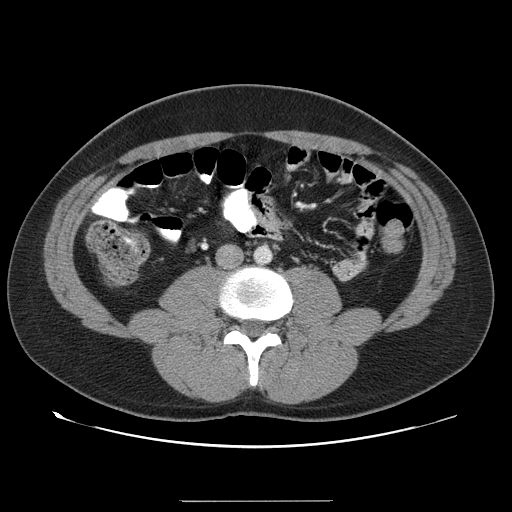
[im 56/108  soft-tissue]
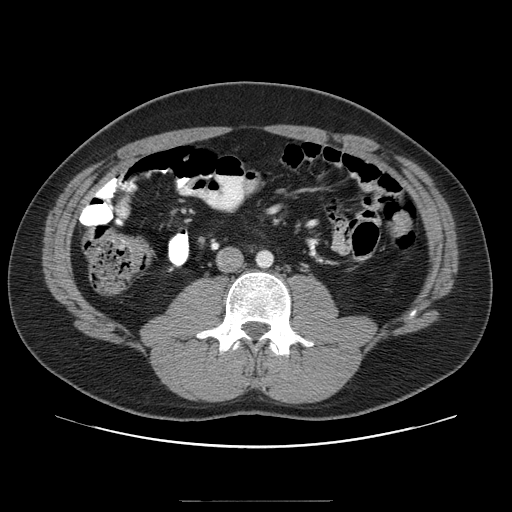
[im 65/108  soft-tissue]
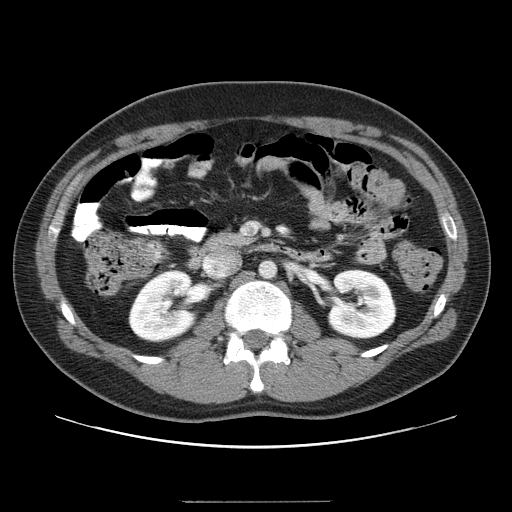
[im 65/108  bone]
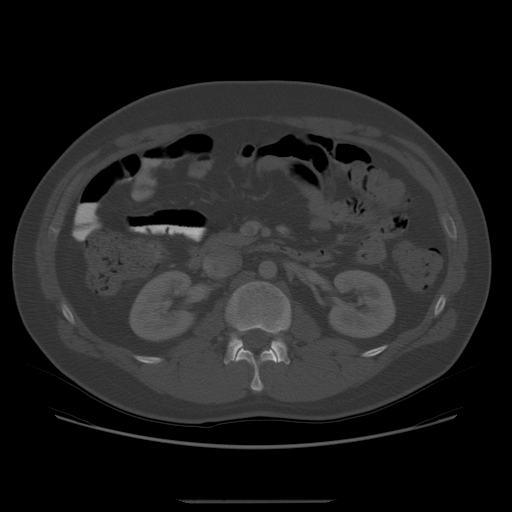
[im 73/108  soft-tissue]
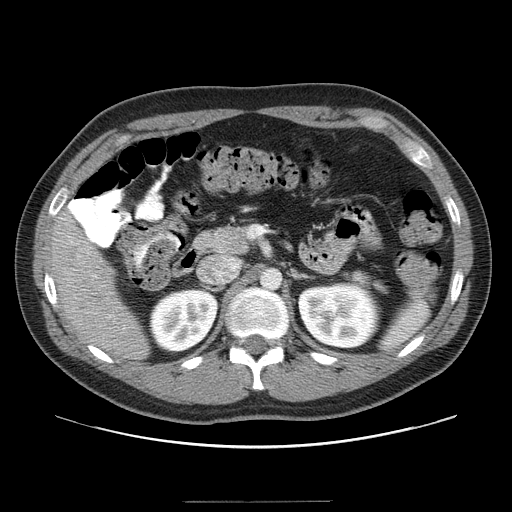
[im 82/108  soft-tissue]
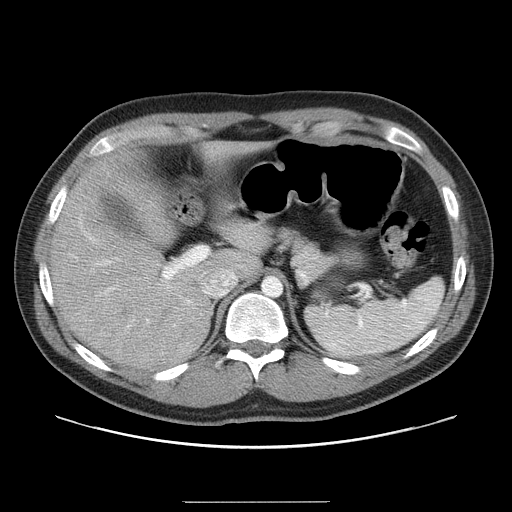
[im 86/108  soft-tissue]
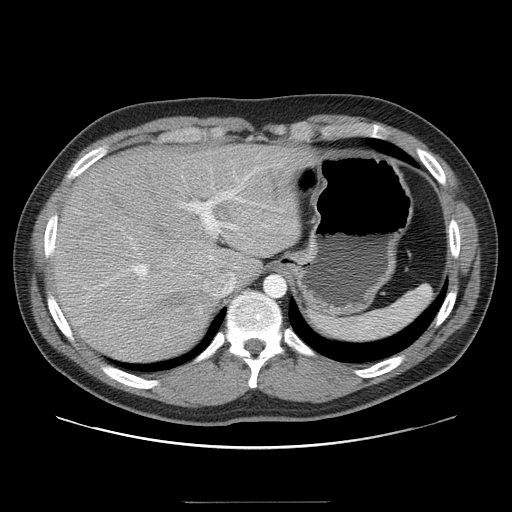
[im 95/108  soft-tissue]
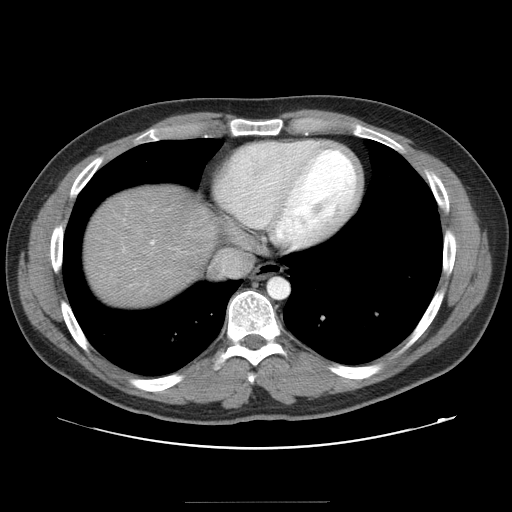
[im 103/108  soft-tissue]
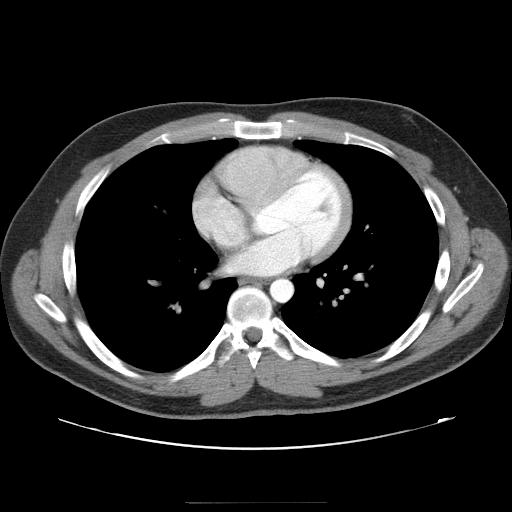

[Series 400: coronal · coronal · 1.14mm/px · 3 of 121 slices shown]
[im 41/121  soft-tissue]
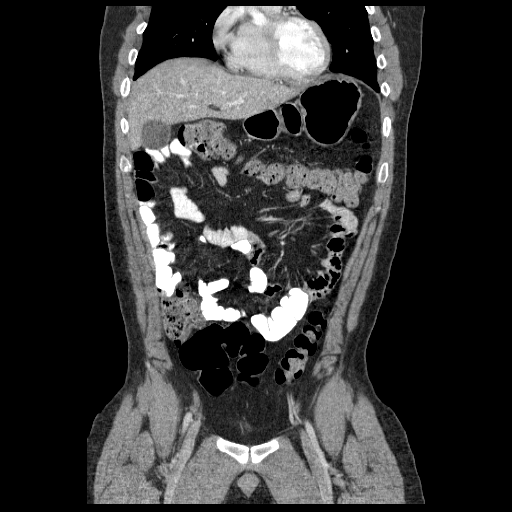
[im 54/121  soft-tissue]
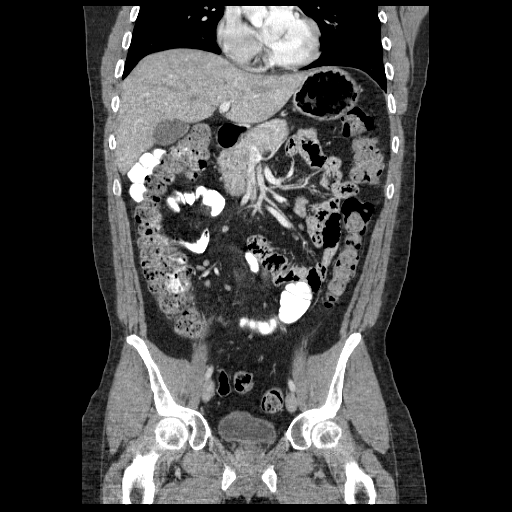
[im 67/121  soft-tissue]
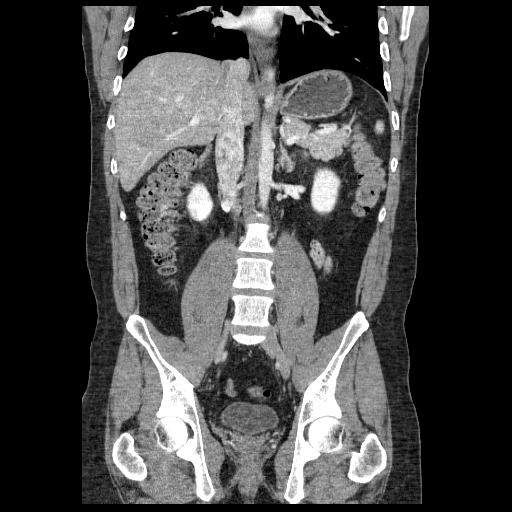

[17 of 46 positions shown; findings below may reference images not displayed]

FINDINGS: Lung Bases: Unremarkable.

Abdomen/Pelvis: The appearance of the liver, gallbladder, pancreas,
spleen, bilateral adrenal glands and bilateral kidneys is
unremarkable. The appendix is not confidently identified. However,
there are no inflammatory changes adjacent to the cecum to suggest
presence of an acute appendicitis at this time. No significant
volume of ascites. No pneumoperitoneum. No pathologic distention of
small bowel. No definite lymphadenopathy identified within the
abdomen or pelvis. Prostate gland and urinary bladder are
unremarkable in appearance.

Musculoskeletal: There are no aggressive appearing lytic or blastic
lesions noted in the visualized portions of the skeleton.
IMPRESSION: 1. No acute findings in the abdomen or pelvis to account for the
patient's symptoms.

## 2015-11-22 IMAGING — CT CT ABD-PELV W/O CM
2 of 4 series · 16 of 46 positions shown, 18 images · non-contrast
Comparison: The lung bases are clear.

CLINICAL DATA: 28-year-old male with abdominal and pelvic pain and
nausea.

EXAM:
CT ABDOMEN AND PELVIS WITHOUT CONTRAST
TECHNIQUE: Multidetector CT imaging of the abdomen and pelvis was performed
following the standard protocol without IV contrast.

[Series 2: abd/ pelvis 5.0 i30f 1 · axial · 0.79mm/px · z∈[+788,+1352]mm · 13 of 123 slices shown, 15 images]
[im 5/123  soft-tissue]
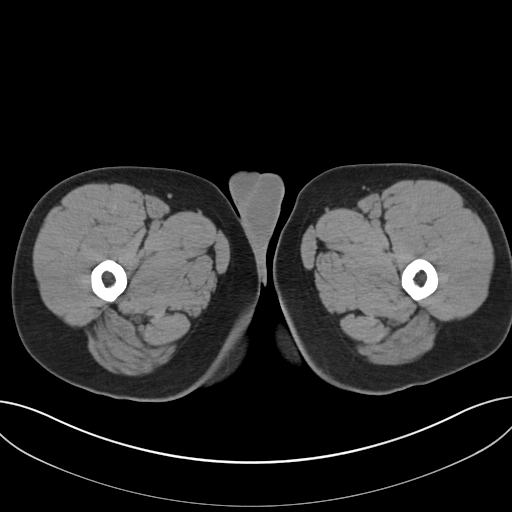
[im 5/123  bone]
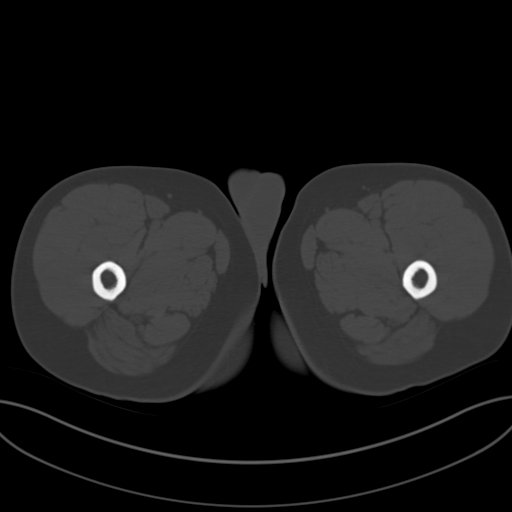
[im 15/123  soft-tissue]
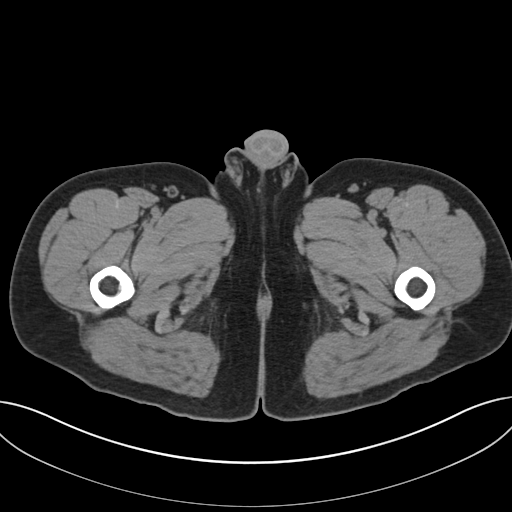
[im 24/123  soft-tissue]
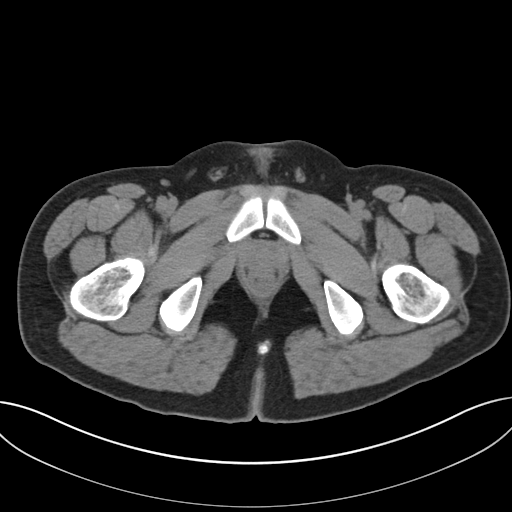
[im 33/123  soft-tissue]
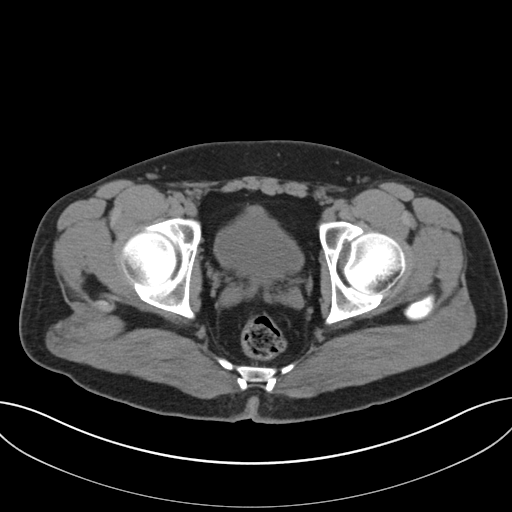
[im 43/123  soft-tissue]
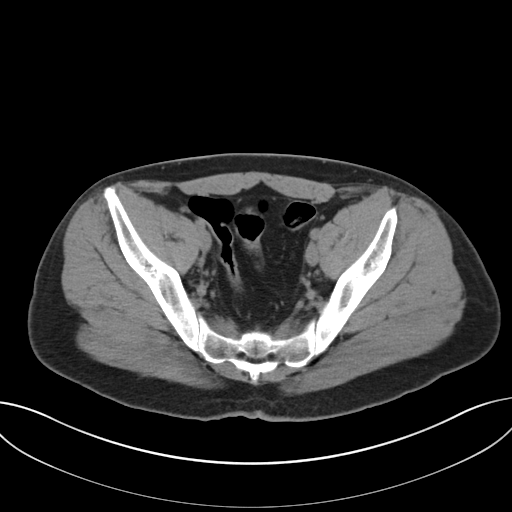
[im 52/123  soft-tissue]
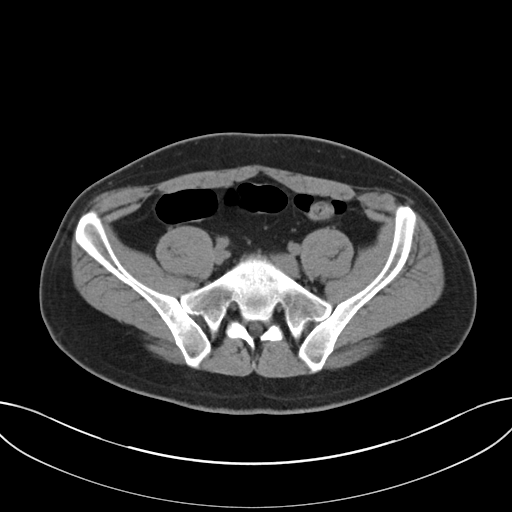
[im 62/123  soft-tissue]
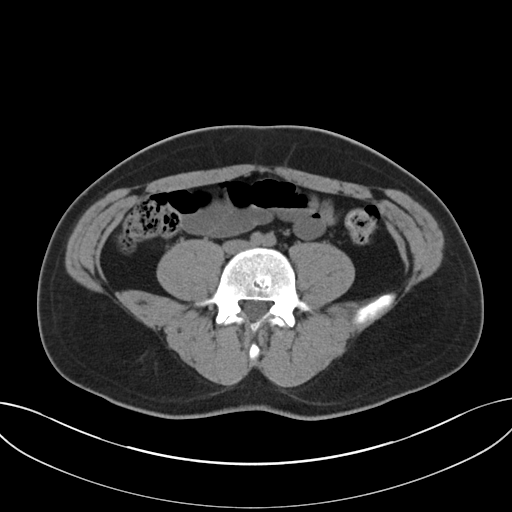
[im 71/123  soft-tissue]
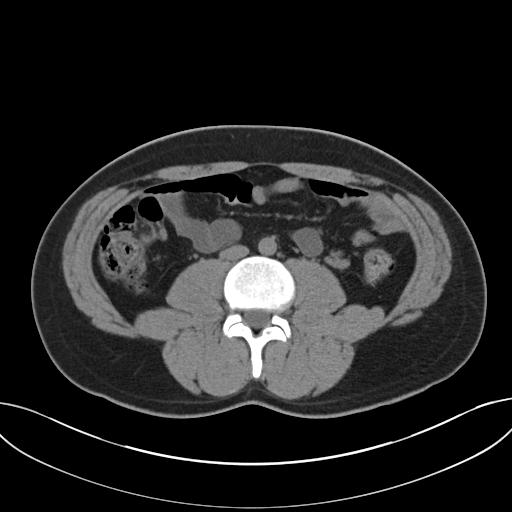
[im 80/123  soft-tissue]
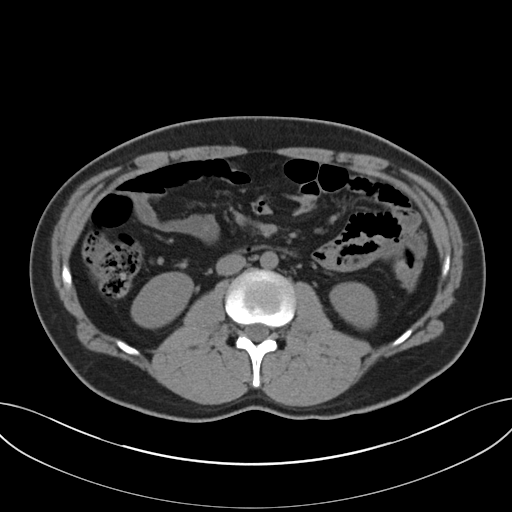
[im 80/123  bone]
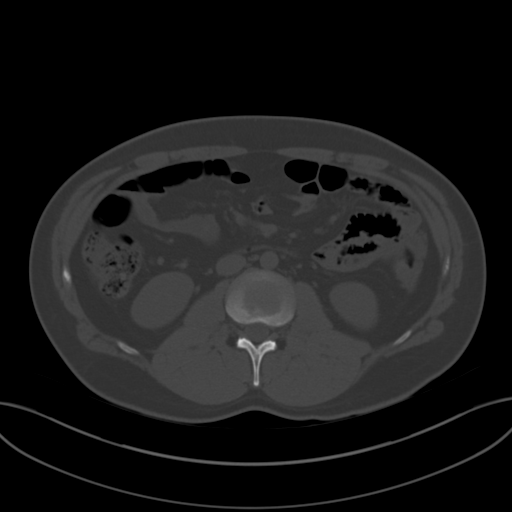
[im 90/123  soft-tissue]
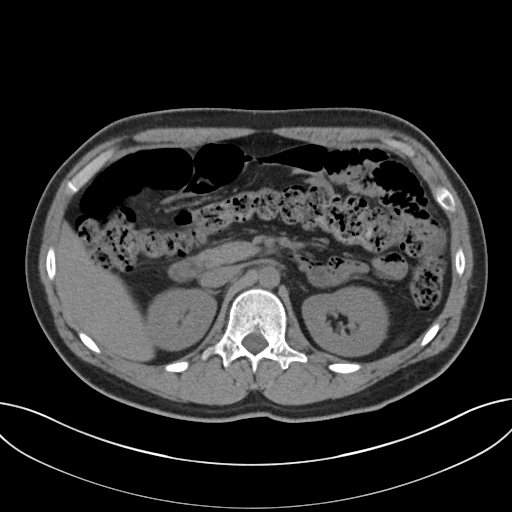
[im 99/123  soft-tissue]
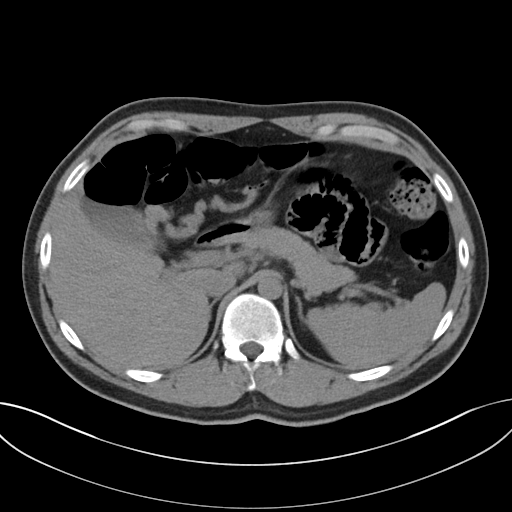
[im 108/123  soft-tissue]
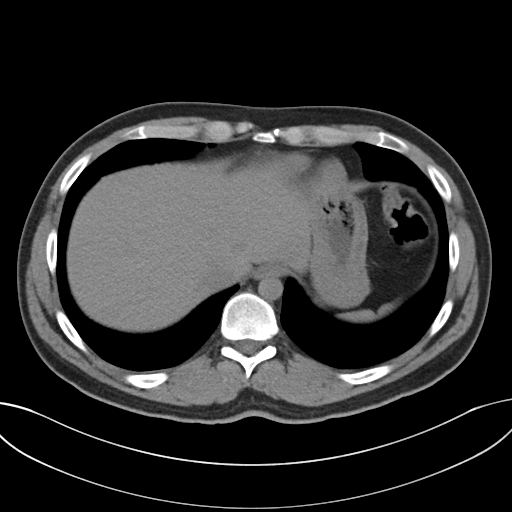
[im 118/123  soft-tissue]
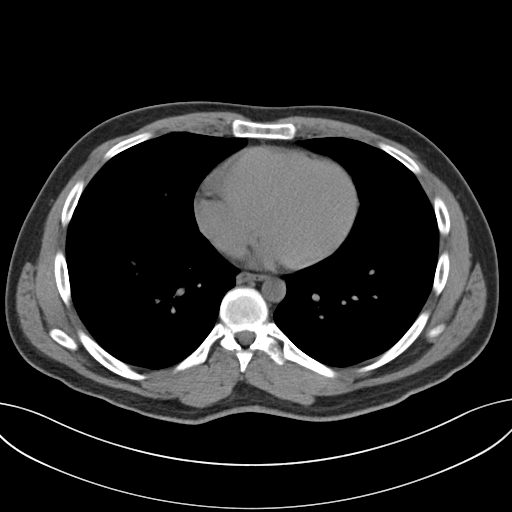

[Series 5: coronals · coronal · 0.84mm/px · 3 of 123 slices shown]
[im 41/123  soft-tissue]
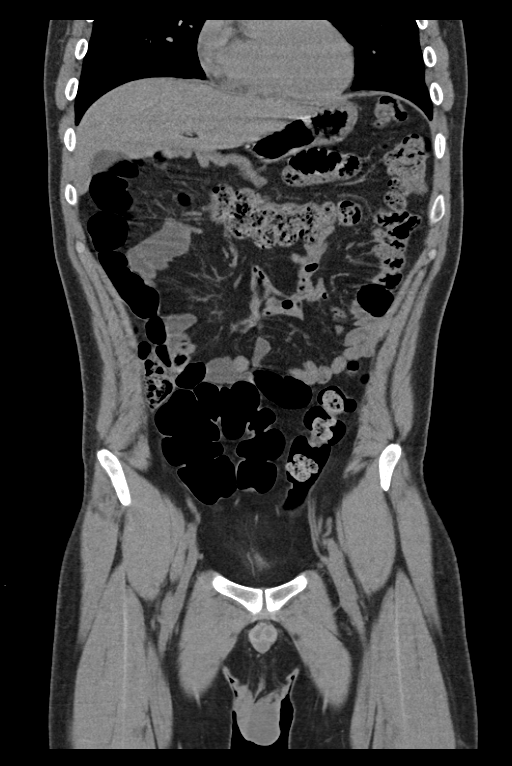
[im 55/123  soft-tissue]
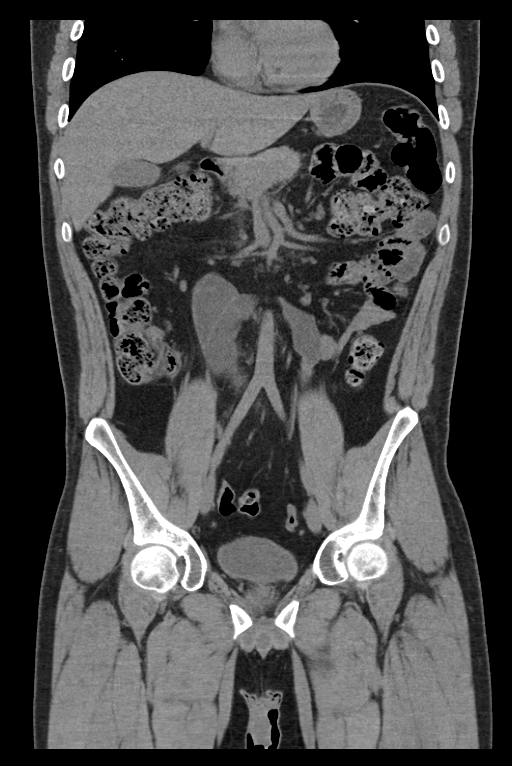
[im 68/123  soft-tissue]
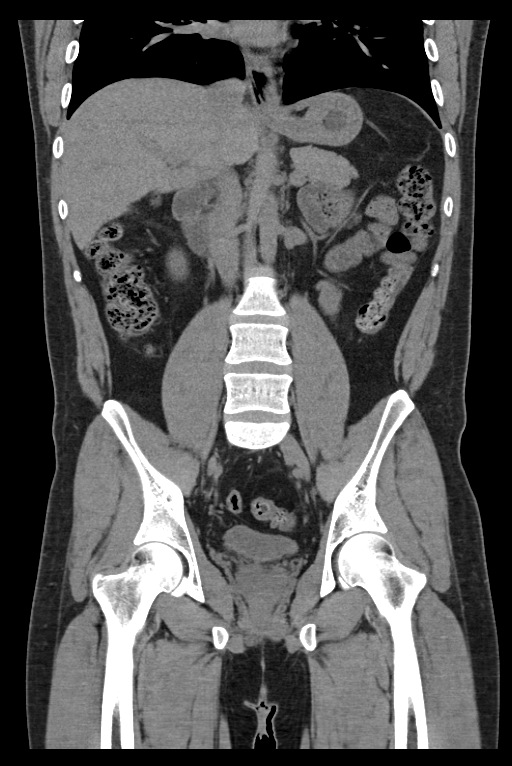

[16 of 46 positions shown; findings below may reference images not displayed]

The liver, gallbladder, spleen, pancreas, adrenal glands and kidneys
are unremarkable.

There is no evidence of hydronephrosis or urinary calculi.

Please note that parenchymal abnormalities may be missed without
intravenous contrast.

There is no evidence of free fluid, enlarged lymph nodes, biliary
dilation or abdominal aortic aneurysm.

The bowel and bladder are unremarkable. There is no evidence of
bowel obstruction, pneumoperitoneum or focal collection.

No acute or suspicious bony abnormalities are present.
FINDINGS: Unremarkable exam.  No evidence of acute or significant abnormality.

## 2015-11-22 IMAGING — US US ART/VEN ABD/PELV/SCROTUM DOPPLER LTD
1 series · 14 of 25 positions shown · non-contrast
Comparison: None.

CLINICAL DATA: 28-year-old male with right testicular pain.

EXAM:
SCROTAL ULTRASOUND
DOPPLER ULTRASOUND OF THE TESTICLES
TECHNIQUE: Complete ultrasound examination of the testicles, epididymis, and
other scrotal structures was performed. Color and spectral Doppler
ultrasound were also utilized to evaluate blood flow to the
testicles.

[Series 1: us art/ven abd/pelv/scrotum doppler ltd · 0.06mm/px · 14 of 65 slices shown]
[im 1/65]
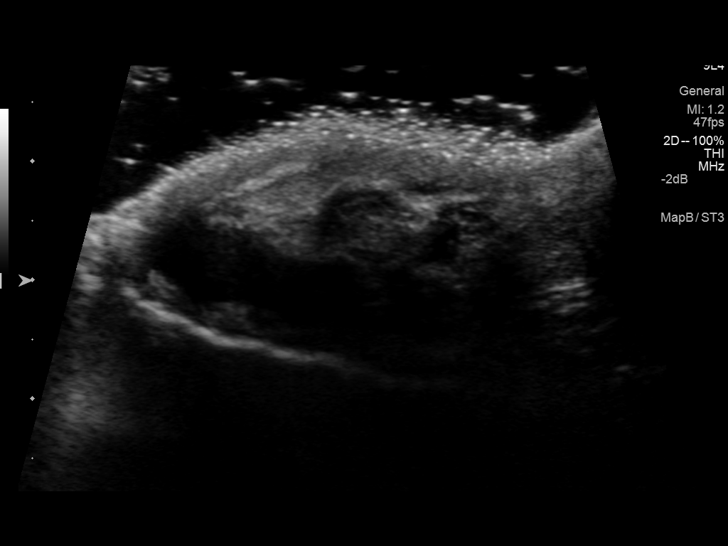
[im 6/65]
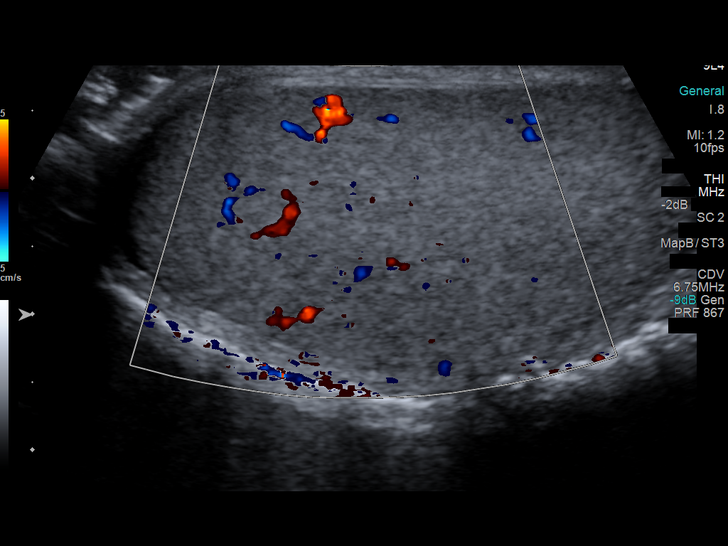
[im 11/65]
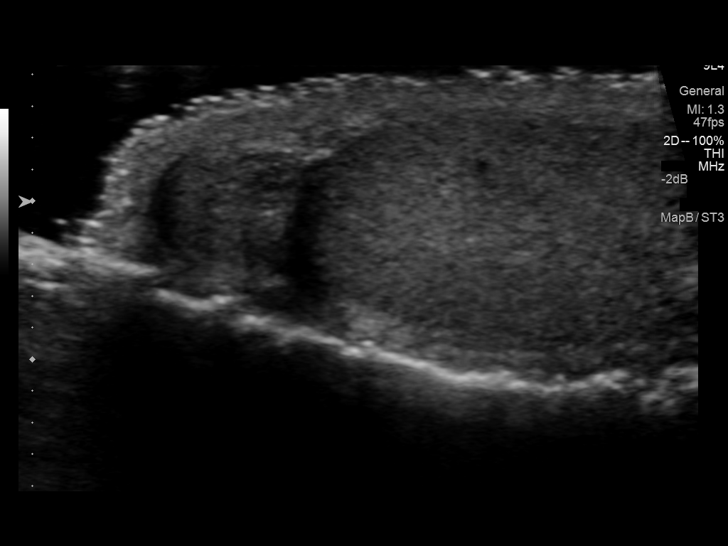
[im 17/65]
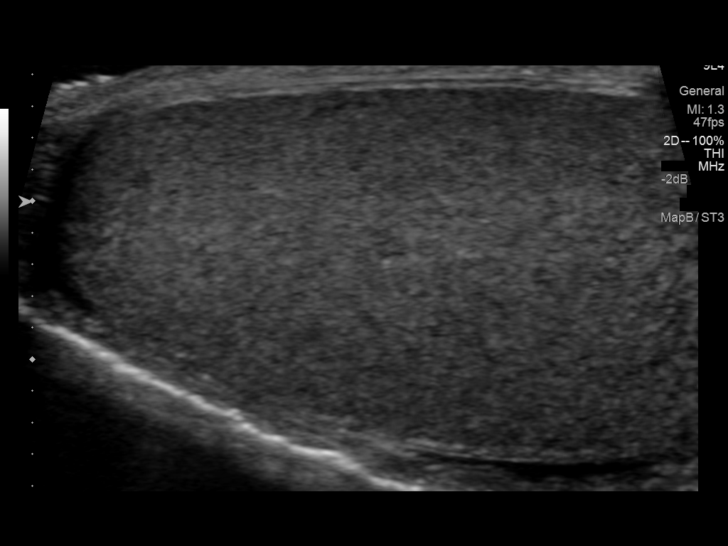
[im 22/65]
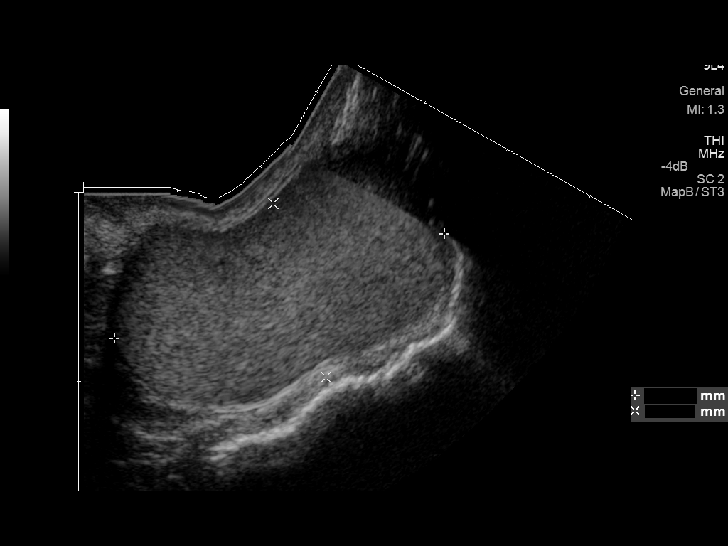
[im 25/65]
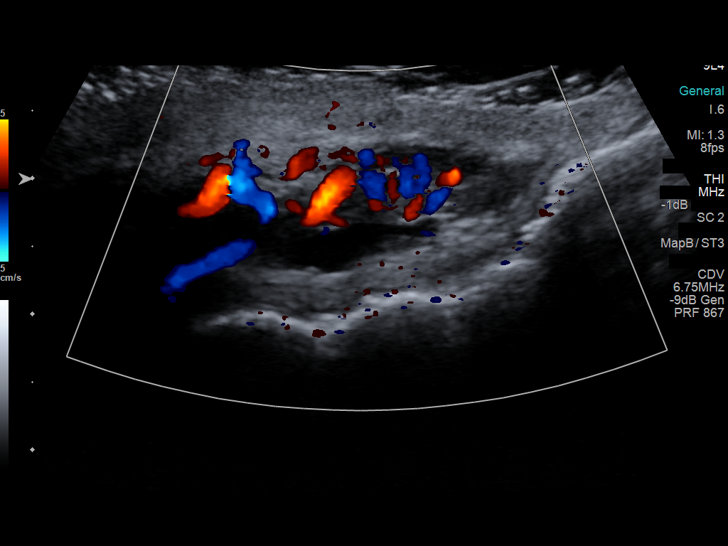
[im 30/65]
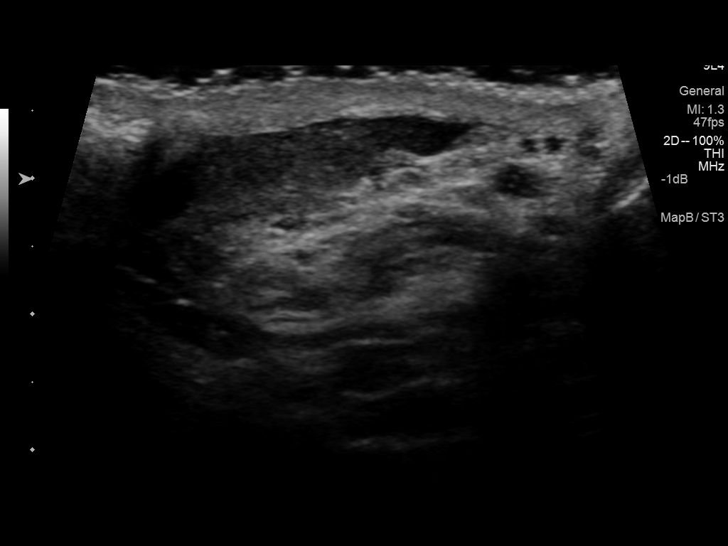
[im 35/65]
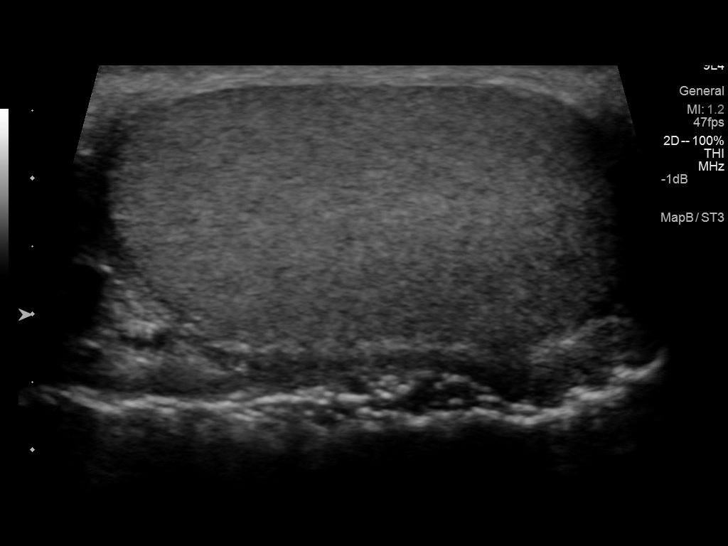
[im 41/65]
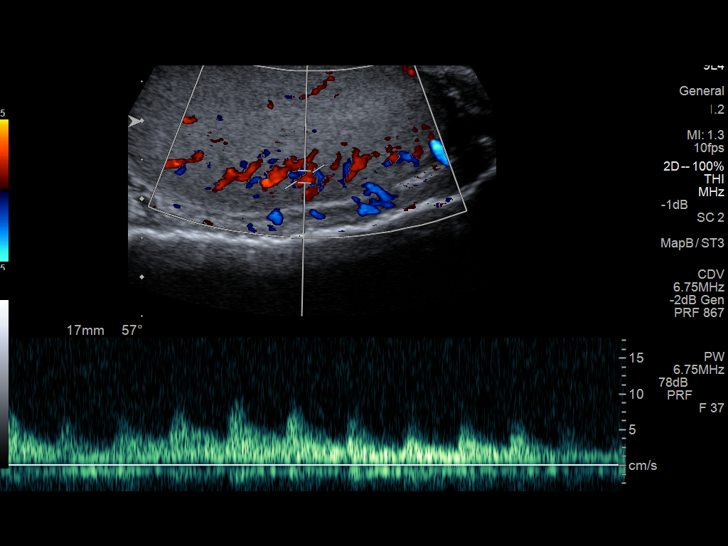
[im 43/65]
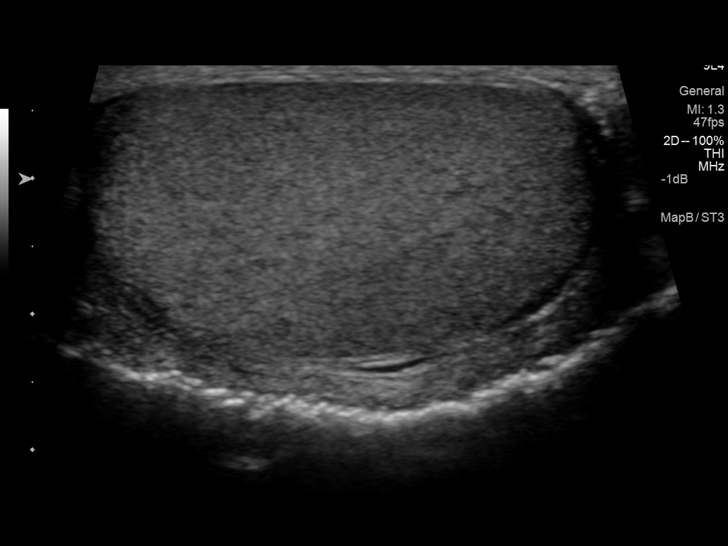
[im 49/65]
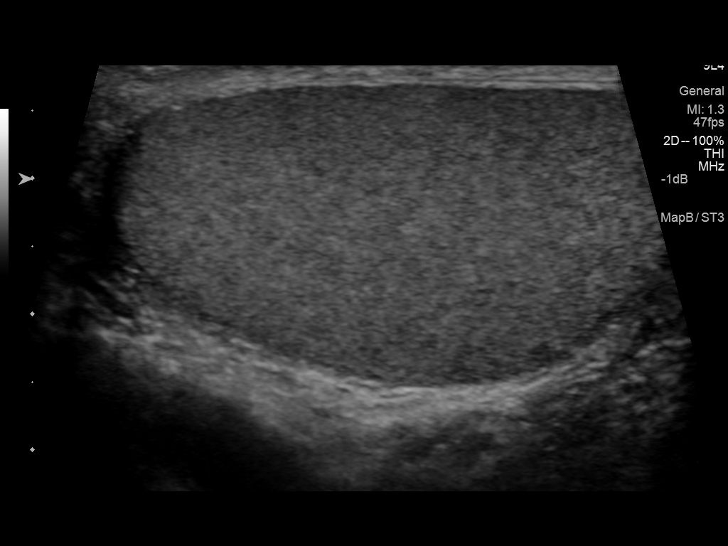
[im 54/65]
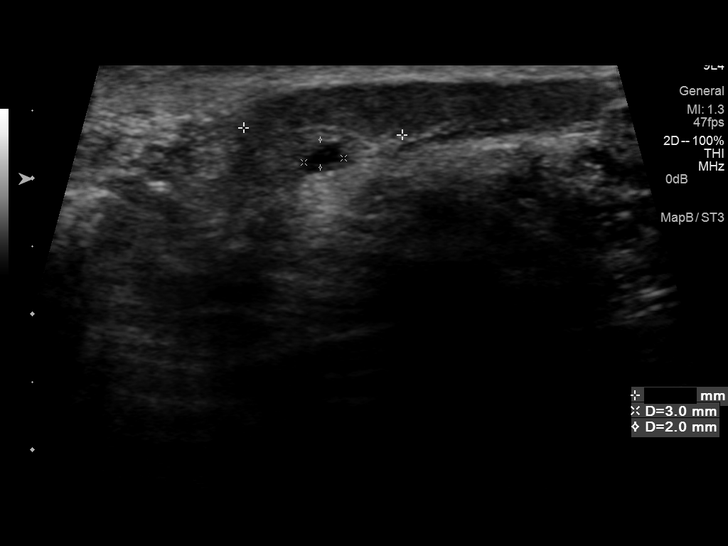
[im 59/65]
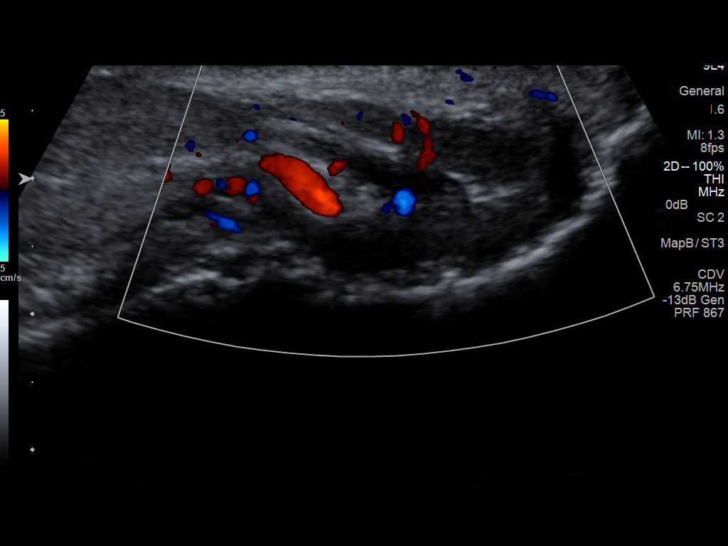
[im 65/65]
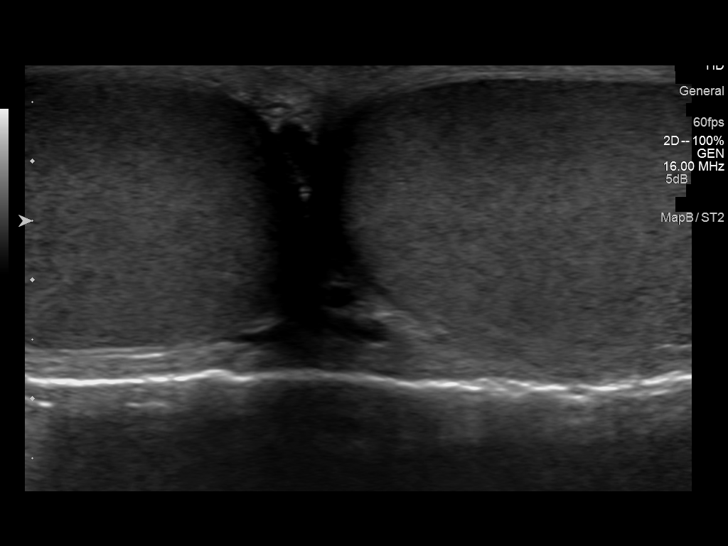

[14 of 25 positions shown; findings below may reference images not displayed]

FINDINGS: Right testicle

Measurements: 3.7 x 1.9 x 3 cm. No mass or microlithiasis
visualized.

Left testicle

Measurements: 4.5 x 2.3 x 4.1 cm. No mass or microlithiasis
visualized.

Right epididymis:  Normal in size and appearance.

Left epididymis:  A 3 mm epididymal cyst is present.

Hydrocele:  Small bilateral hydroceles are present.

Varicocele:  None visualized.

Pulsed Doppler interrogation of both testes demonstrates low
resistance arterial and venous waveforms bilaterally.
IMPRESSION: Normal testicles.  No evidence of testicular torsion or mass.

Small bilateral hydroceles without other significant abnormality.
